# Patient Record
Sex: Female | Born: 1995
Health system: Southern US, Community
[De-identification: ages and names within clinical notes are randomized; demographics above are authoritative.]

## PROBLEM LIST (undated history)

## (undated) DIAGNOSIS — G43909 Migraine, unspecified, not intractable, without status migrainosus: Secondary | ICD-10-CM

## (undated) DIAGNOSIS — O1490 Unspecified pre-eclampsia, unspecified trimester: Secondary | ICD-10-CM

## (undated) DIAGNOSIS — N883 Incompetence of cervix uteri: Secondary | ICD-10-CM

## (undated) HISTORY — DX: Unspecified pre-eclampsia, unspecified trimester: O14.90

## (undated) HISTORY — DX: Migraine, unspecified, not intractable, without status migrainosus: G43.909

## (undated) HISTORY — PX: URETHRA SURGERY: SHX824

---
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
## 2017-05-07 ENCOUNTER — Encounter (HOSPITAL_BASED_OUTPATIENT_CLINIC_OR_DEPARTMENT_OTHER): Payer: Self-pay

## 2017-05-07 ENCOUNTER — Emergency Department (HOSPITAL_BASED_OUTPATIENT_CLINIC_OR_DEPARTMENT_OTHER)
Admission: EM | Admit: 2017-05-07 | Discharge: 2017-05-07 | Disposition: A | Discharge status: Home / Self Care | Payer: Self-pay | Attending: Emergency Medicine | Admitting: Emergency Medicine

## 2017-05-07 ENCOUNTER — Emergency Department (HOSPITAL_BASED_OUTPATIENT_CLINIC_OR_DEPARTMENT_OTHER): Payer: Self-pay

## 2017-05-07 DIAGNOSIS — F17200 Nicotine dependence, unspecified, uncomplicated: Secondary | ICD-10-CM | POA: Insufficient documentation

## 2017-05-07 DIAGNOSIS — G43909 Migraine, unspecified, not intractable, without status migrainosus: Secondary | ICD-10-CM | POA: Insufficient documentation

## 2017-05-07 LAB — PREGNANCY, URINE: PREG TEST UR: NEGATIVE

## 2017-05-07 MED ORDER — KETOROLAC TROMETHAMINE 30 MG/ML IJ SOLN
15.0000 mg | Freq: Once | INTRAMUSCULAR | Status: AC
  Administered 2017-05-07: 15 mg via INTRAVENOUS
  Filled 2017-05-07: qty 1

## 2017-05-07 MED ORDER — SODIUM CHLORIDE 0.9 % IV BOLUS (SEPSIS)
500.0000 mL | Freq: Once | INTRAVENOUS | Status: AC
  Administered 2017-05-07: 500 mL via INTRAVENOUS

## 2017-05-07 MED ORDER — PROCHLORPERAZINE EDISYLATE 5 MG/ML IJ SOLN
10.0000 mg | Freq: Once | INTRAMUSCULAR | Status: AC
  Administered 2017-05-07: 10 mg via INTRAVENOUS
  Filled 2017-05-07: qty 2

## 2017-05-07 NOTE — ED Provider Notes (Signed)
[facility] Provider Note   CSN: [id number] Arrival date & time: 05/07/17  1522     History   Chief Complaint Chief Complaint  Patient presents with  . Migraine    HPI Claire Nash is a 21 y.o. female.  HPI Patient presents with headaches. Has been having headaches over the last month. States he did not really go away. Has a history of migraines when she was younger but states this feels different. Last night however was more like her migraines she had before. Severe frontal pain. Some nausea and vomited once. States around a month ago she did hit her head on however it was having increased pain since then. No numbness or weakness. No vision changes. Some photophobia.   Past Medical History:  Diagnosis Date  . Migraines     Patient Active Problem List   Diagnosis Date Noted  . Viral illness 02/22/2014  . Back pain 10/13/2013  . Chronic pelvic pain in female 04/23/2013  . Contraception management 09/09/2012  . Viral URI 01/23/2012  . MIGRAINE WITH AURA 12/05/2010    Past Surgical History:  Procedure Laterality Date  . URETHRA SURGERY      OB History    No data available       Home Medications    Prior to Admission medications   Not on File    Family History Family History  Problem Relation Age of Onset  . Hypertension Mother   . Fibroids Mother   . Hypertension Father   . Asthma Father   . Hypertension Maternal Grandmother   . Drug abuse Maternal Grandmother   . Drug abuse Maternal Grandfather   . Breast cancer Maternal Aunt     Social History Social History  Substance Use Topics  . Smoking status: Current Some Day Smoker  . Smokeless tobacco: Never Used  . Alcohol use Yes     Comment: occ     Allergies   Patient has no known allergies.   Review of Systems Review of Systems  Constitutional: Negative for appetite change.  HENT: Negative for congestion.   Eyes: Positive for photophobia.  Respiratory: Negative for  shortness of breath.   Gastrointestinal: Negative for abdominal pain.  Genitourinary: Negative for flank pain.  Musculoskeletal: Negative for back pain.  Neurological: Positive for headaches.  Hematological: Negative for adenopathy.  Psychiatric/Behavioral: Negative for confusion.     Physical Exam Updated Vital Signs BP 132/89   Pulse 74   Temp 98.4 F (36.9 C) (Oral)   Resp 18   Ht 5\' 7"  (1.702 m)   Wt 180 lb (81.6 kg)   LMP 04/30/2017   SpO2 100%   BMI 28.19 kg/m   Physical Exam  Constitutional: She appears well-developed.  HENT:  Head: Atraumatic.  Eyes: EOM are normal.  Neck: Neck supple.  Cardiovascular: Normal rate.   Pulmonary/Chest: Effort normal.  Abdominal: Soft.  Musculoskeletal: She exhibits no edema.  Neurological: She is alert.  Skin: Skin is warm. Capillary refill takes less than 2 seconds.  Psychiatric: She has a normal mood and affect.     ED Treatments / Results  Labs (all labs ordered are listed, but only abnormal results are displayed) Labs Reviewed  PREGNANCY, URINE    EKG  EKG Interpretation None       Radiology Ct Head Wo Contrast  Result Date: 05/07/2017 CLINICAL DATA:  Headache for 10 hours.  Trauma 1 or 2 months ago. EXAM: CT HEAD WITHOUT CONTRAST TECHNIQUE: Contiguous axial images  were obtained from the base of the skull through the vertex without intravenous contrast. COMPARISON:  December 02, 2010 FINDINGS: Brain: No subdural, epidural, or subarachnoid hemorrhage. The cerebellum, brainstem, and basal cisterns are normal. No mass, mass effect, or midline shift. Ventricles and sulci are normal. No acute cortical ischemia or infarct. Vascular: No hyperdense vessel or unexpected calcification. Skull: Normal. Negative for fracture or focal lesion. Sinuses/Orbits: No acute finding. Other: None. IMPRESSION: No acute abnormalities identified to explain the patient's symptoms. Electronically Signed   By: Gerome Sam III M.D   On:  05/07/2017 17:31    Procedures Procedures (including critical care time)  Medications Ordered in ED Medications  ketorolac (TORADOL) 30 MG/ML injection 15 mg (15 mg Intravenous Given 05/07/17 1724)  prochlorperazine (COMPAZINE) injection 10 mg (10 mg Intravenous Given 05/07/17 1724)  sodium chloride 0.9 % bolus 500 mL (500 mLs Intravenous New Bag/Given 05/07/17 1713)     Initial Impression / Assessment and Plan / ED Course  I have reviewed the triage vital signs and the nursing notes.  Pertinent labs & imaging results that were available during my care of the patient were reviewed by me and considered in my medical decision making (see chart for details).     Patient with headache. History of migraines. Feels better after treatment. CT scan done due to recent head trauma. Negative. Feels better and will be discharged home.  Final Clinical Impressions(s) / ED Diagnoses   Final diagnoses:  Migraine without status migrainosus, not intractable, unspecified migraine type    New Prescriptions New Prescriptions   No medications on file     Benjiman Core, MD 05/07/17 [phone]

## 2017-05-07 NOTE — ED Triage Notes (Signed)
C/o migraine started lat night-vomited x 1 last night-NAD-steady gait

## 2017-05-07 NOTE — Discharge Instructions (Signed)
Follow-up with your Dr. as needed for the headache.

## 2019-07-13 ENCOUNTER — Other Ambulatory Visit: Payer: Self-pay

## 2019-07-13 ENCOUNTER — Inpatient Hospital Stay (HOSPITAL_COMMUNITY): Payer: Medicaid Other

## 2019-07-13 ENCOUNTER — Inpatient Hospital Stay (HOSPITAL_COMMUNITY)
Admission: AD | Admit: 2019-07-13 | Discharge: 2019-07-13 | Disposition: A | Discharge status: Home / Self Care | Payer: Medicaid Other | Attending: Obstetrics & Gynecology | Admitting: Obstetrics & Gynecology

## 2019-07-13 ENCOUNTER — Encounter (HOSPITAL_COMMUNITY): Payer: Self-pay | Admitting: *Deleted

## 2019-07-13 DIAGNOSIS — O99611 Diseases of the digestive system complicating pregnancy, first trimester: Secondary | ICD-10-CM | POA: Insufficient documentation

## 2019-07-13 DIAGNOSIS — O9989 Other specified diseases and conditions complicating pregnancy, childbirth and the puerperium: Secondary | ICD-10-CM | POA: Diagnosis not present

## 2019-07-13 DIAGNOSIS — Z3A08 8 weeks gestation of pregnancy: Secondary | ICD-10-CM | POA: Insufficient documentation

## 2019-07-13 DIAGNOSIS — O26891 Other specified pregnancy related conditions, first trimester: Secondary | ICD-10-CM

## 2019-07-13 DIAGNOSIS — O99331 Smoking (tobacco) complicating pregnancy, first trimester: Secondary | ICD-10-CM | POA: Diagnosis not present

## 2019-07-13 DIAGNOSIS — K5900 Constipation, unspecified: Secondary | ICD-10-CM | POA: Diagnosis not present

## 2019-07-13 DIAGNOSIS — F17200 Nicotine dependence, unspecified, uncomplicated: Secondary | ICD-10-CM | POA: Insufficient documentation

## 2019-07-13 DIAGNOSIS — R109 Unspecified abdominal pain: Secondary | ICD-10-CM | POA: Insufficient documentation

## 2019-07-13 DIAGNOSIS — Z803 Family history of malignant neoplasm of breast: Secondary | ICD-10-CM | POA: Diagnosis not present

## 2019-07-13 DIAGNOSIS — O26899 Other specified pregnancy related conditions, unspecified trimester: Secondary | ICD-10-CM

## 2019-07-13 LAB — COMPREHENSIVE METABOLIC PANEL
ALT: 15 U/L (ref 0–44)
AST: 23 U/L (ref 15–41)
Albumin: 3.8 g/dL (ref 3.5–5.0)
Alkaline Phosphatase: 50 U/L (ref 38–126)
Anion gap: 6 (ref 5–15)
BUN: 7 mg/dL (ref 6–20)
CO2: 25 mmol/L (ref 22–32)
Calcium: 9.6 mg/dL (ref 8.9–10.3)
Chloride: 104 mmol/L (ref 98–111)
Creatinine, Ser: 0.93 mg/dL (ref 0.44–1.00)
GFR calc Af Amer: >60 mL/min (ref >60)
GFR calc non Af Amer: >60 mL/min (ref >60)
Glucose, Bld: 84 mg/dL (ref 70–99)
Potassium: 4.6 mmol/L (ref 3.5–5.1)
Sodium: 135 mmol/L (ref 135–145)
Total Bilirubin: 0.7 mg/dL (ref 0.3–1.2)
Total Protein: 6.9 g/dL (ref 6.5–8.1)

## 2019-07-13 LAB — POCT PREGNANCY, URINE: Preg Test, Ur: POSITIVE — AB

## 2019-07-13 LAB — CBC
HCT: 39.3 % (ref 36.0–46.0)
Hemoglobin: 13.2 g/dL (ref 12.0–15.0)
MCH: 30.5 pg (ref 26.0–34.0)
MCHC: 33.6 g/dL (ref 30.0–36.0)
MCV: 90.8 fL (ref 80.0–100.0)
Platelets: 177 10*3/uL (ref 150–400)
RBC: 4.33 MIL/uL (ref 3.87–5.11)
RDW: 13.3 % (ref 11.5–15.5)
WBC: 13.5 10*3/uL — ABNORMAL HIGH (ref 4.0–10.5)
nRBC: 0 % (ref 0.0–0.2)

## 2019-07-13 LAB — HCG, QUANTITATIVE, PREGNANCY: hCG, Beta Chain, Quant, S: 102811 m[IU]/mL — ABNORMAL HIGH (ref <5)

## 2019-07-13 LAB — URINALYSIS, ROUTINE W REFLEX MICROSCOPIC
Bilirubin Urine: NEGATIVE
Glucose, UA: NEGATIVE mg/dL
Hgb urine dipstick: NEGATIVE
Ketones, ur: NEGATIVE mg/dL
Leukocytes,Ua: NEGATIVE
Nitrite: NEGATIVE
Protein, ur: NEGATIVE mg/dL
Specific Gravity, Urine: 1.012 (ref 1.005–1.030)
pH: 6 (ref 5.0–8.0)

## 2019-07-13 LAB — WET PREP, GENITAL
Clue Cells Wet Prep HPF POC: NONE SEEN
Sperm: NONE SEEN
Trich, Wet Prep: NONE SEEN
Yeast Wet Prep HPF POC: NONE SEEN

## 2019-07-13 MED ORDER — POLYETHYLENE GLYCOL 3350 17 G PO PACK: 17.0000 g | PACK | Freq: Every day | ORAL | 0 refills | Status: DC

## 2019-07-13 MED ORDER — DOCUSATE SODIUM 100 MG PO TABS: 100.0000 mg | ORAL_TABLET | Freq: Three times a day (TID) | ORAL | 0 refills | Status: DC

## 2019-07-13 NOTE — MAU Provider Note (Addendum)
Patient Claire SavoyZakiya Nash is a 23 y.o. G1P0 At 8721w2d here with complaints of on-again/off-again spotting and now with consistent abdominal pain. She denies other abnormal discharge, dysuria, NV, diarrhea, fever, chills, HA.  She has a history of constipation; she has not had a BM since Friday (3 days ago).  History     CSN: [id number]  Arrival date and time: 07/13/19 1432   First Provider Initiated Contact with Patient 07/13/19 1620      Chief Complaint  Patient presents with  . Abdominal Pain   Abdominal Pain This is a new problem. The current episode started yesterday. The onset quality is sudden. The problem occurs constantly. The problem has been unchanged. The quality of the pain is cramping. The abdominal pain does not radiate. Pertinent negatives include no constipation, diarrhea or dysuria. Nothing aggravates the pain. The pain is relieved by nothing.    OB History    Gravida  1   Para      Term      Preterm      AB      Living        SAB      TAB      Ectopic      Multiple      Live Births              Past Medical History:  Diagnosis Date  . Migraines     Past Surgical History:  Procedure Laterality Date  . URETHRA SURGERY      Family History  Problem Relation Age of Onset  . Hypertension Mother   . Fibroids Mother   . Hypertension Father   . Asthma Father   . Hypertension Maternal Grandmother   . Drug abuse Maternal Grandmother   . Drug abuse Maternal Grandfather   . Breast cancer Maternal Aunt     Social History   Tobacco Use  . Smoking status: Current Some Day Smoker  . Smokeless tobacco: Never Used  Substance Use Topics  . Alcohol use: Yes    Comment: occ  . Drug use: Yes    Types: Marijuana    Allergies: No Known Allergies  Medications Prior to Admission  Medication Sig Dispense Refill Last Dose  . Prenatal Vit-Fe Fumarate-FA (PRENATAL MULTIVITAMIN) TABS tablet Take 1 tablet by mouth daily at 12 noon.   07/13/2019 at  Unknown time    Review of Systems  HENT: Negative.   Gastrointestinal: Positive for abdominal pain. Negative for constipation and diarrhea.  Genitourinary: Negative for dysuria.  Neurological: Negative.   Psychiatric/Behavioral: Negative.    Physical Exam   Blood pressure 132/66, pulse 64, temperature 98.4 F (36.9 C), resp. rate 16, last menstrual period 05/16/2019, SpO2 99 %.  Physical Exam  Constitutional: She is oriented to person, place, and time. She appears well-developed.  HENT:  Head: Normocephalic.  Neck: Normal range of motion.  Respiratory: Effort normal.  GI: Soft.  Genitourinary:    Vagina normal.   Musculoskeletal: Normal range of motion.  Neurological: She is alert and oriented to person, place, and time.  Skin: Skin is warm and dry.    MAU Course  Procedures  MDM -UA normal -wet prep is normal -GC pending -beta HCG pending -US shows SIUP with fetal cardiac activity , no other acute processes noted.   Assessment and Plan   1. Abdominal pain affecting pregnancy   2. Abdominal pain   3. Constipation, unspecified constipation type    -Reassured patient that pregnancy  appears healthy. Most likely MSK.  -Try Miralax in liquid; buy fiber tablets. - If symptoms don't resolve, try enema.  -Return to MAU if she has a change in symptoms, bleeding or pain increases.   -List of ob providers given.  -Keep dating at 02/19/2019 (8 weeks 2 days)  Mervyn Skeeters Claire Nash 07/13/2019, 4:20 PM

## 2019-07-13 NOTE — Discharge Instructions (Signed)

## 2019-07-13 NOTE — MAU Note (Signed)
.   Claire Nash is a 23 y.o. at [redacted]w[redacted]d here in MAU reporting: lower abdominal cramping and spotting for a couple of weeks on and off LMP: 05/16/19 Onset of complaint:couple of weeks Pain score: 8 Vitals:   07/13/19 1518  BP: 132/66  Pulse: 64  Resp: 16  Temp: 98.4 F (36.9 C)  SpO2: 99%      Lab orders placed from triage: UA/UPT

## 2019-07-14 LAB — GC/CHLAMYDIA PROBE AMP (~~LOC~~) NOT AT ARMC
Chlamydia: NEGATIVE
Neisseria Gonorrhea: NEGATIVE

## 2019-07-14 LAB — ABO/RH: ABO/RH(D): O POS

## 2019-08-14 ENCOUNTER — Encounter: Payer: Self-pay | Admitting: Women's Health

## 2019-08-14 ENCOUNTER — Ambulatory Visit (INDEPENDENT_AMBULATORY_CARE_PROVIDER_SITE_OTHER): Payer: Medicaid Other | Admitting: Women's Health

## 2019-08-14 ENCOUNTER — Other Ambulatory Visit: Payer: Self-pay

## 2019-08-14 ENCOUNTER — Other Ambulatory Visit (HOSPITAL_COMMUNITY)
Admission: RE | Admit: 2019-08-14 | Discharge: 2019-08-14 | Disposition: A | Discharge status: Home / Self Care | Payer: Medicaid Other | Source: Ambulatory Visit | Attending: Women's Health | Admitting: Women's Health

## 2019-08-14 DIAGNOSIS — Z87898 Personal history of other specified conditions: Secondary | ICD-10-CM

## 2019-08-14 DIAGNOSIS — Z3A12 12 weeks gestation of pregnancy: Secondary | ICD-10-CM | POA: Diagnosis not present

## 2019-08-14 DIAGNOSIS — Z3401 Encounter for supervision of normal first pregnancy, first trimester: Secondary | ICD-10-CM

## 2019-08-14 DIAGNOSIS — F1291 Cannabis use, unspecified, in remission: Secondary | ICD-10-CM | POA: Insufficient documentation

## 2019-08-14 DIAGNOSIS — Z3400 Encounter for supervision of normal first pregnancy, unspecified trimester: Secondary | ICD-10-CM | POA: Insufficient documentation

## 2019-08-14 MED ORDER — BLOOD PRESSURE MONITOR KIT: 1.0000 | PACK | 0 refills | Status: DC

## 2019-08-14 NOTE — Progress Notes (Signed)
Patient is in the office for NOB, pt denies any pain. Currently living together with FOB.

## 2019-08-14 NOTE — Progress Notes (Signed)
History:   Claire Nash is a 23 y.o. G1P0 at 19w6dby LMP being seen today for her first obstetrical visit.  Her obstetrical history is significant for N/A - patients first pregnancy. Patient does intend to breast feed. Pregnancy history fully reviewed.  Patient reports no complaints.  PMH - migraines with aura, pt reports no migraines in 1 year. Pt also reports chronic LBP. PSH - "urinary surgery" as a baby, pt unsure of type. No surgeries since. Family Hx for Patient and Partner - no babies born with any problems. Pt reports family hx of HTN. Social Hx - pt reports smoking marijuana during early pregnancy, but has not smoked in past 3-4 weeks. Pt denies smoking and other drug use. NKDA     HISTORY: OB History  Gravida Para Term Preterm AB Living  1 0 0 0 0 0  SAB TAB Ectopic Multiple Live Births  0 0 0 0 0    # Outcome Date GA Lbr Len/2nd Weight Sex Delivery Anes PTL Lv  1 Current             Last pap smear was done - never. Will perform today.  Past Medical History:  Diagnosis Date  . Migraines    Past Surgical History:  Procedure Laterality Date  . URETHRA SURGERY     Family History  Problem Relation Age of Onset  . Hypertension Mother   . Fibroids Mother   . Hypertension Father   . Asthma Father   . Hypertension Maternal Grandmother   . Drug abuse Maternal Grandmother   . Drug abuse Maternal Grandfather   . Breast cancer Maternal Aunt    Social History   Tobacco Use  . Smoking status: Never Smoker  . Smokeless tobacco: Never Used  Substance Use Topics  . Alcohol use: Not Currently    Comment: occ  . Drug use: Not Currently    Types: Marijuana    Comment: stopped 3 weeks ago   No Known Allergies Current Outpatient Medications on File Prior to Visit  Medication Sig Dispense Refill  . Prenatal Vit-Fe Fumarate-FA (PRENATAL MULTIVITAMIN) TABS tablet Take 1 tablet by mouth daily at 12 noon.    .Mariane BaumgartenSodium 100 MG capsule Take 1 tablet (100 mg  total) by mouth 3 (three) times daily. (Patient not taking: Reported on 08/14/2019) 60 tablet 0  . polyethylene glycol (MIRALAX) 17 g packet Take 17 g by mouth daily. (Patient not taking: Reported on 08/14/2019) 14 each 0   No current facility-administered medications on file prior to visit.     Review of Systems Pertinent items noted in HPI and remainder of comprehensive ROS otherwise negative. Physical Exam:   Vitals:   08/14/19 0917  BP: 132/83  Pulse: 81  Weight: 184 lb 6.4 oz (83.6 kg)   Fetal Heart Rate (bpm): 158 Uterus:    normal, gravid, 12wk size  Pelvic Exam: Perineum: no hemorrhoids, normal perineum   Vulva: normal external genitalia, no lesions   Vagina:  normal mucosa, normal discharge   Cervix: no lesions and normal, pap smear done.    Adnexa: normal adnexa and no mass, fullness, tenderness   Bony Pelvis: average  System: General: well-developed, well-nourished female in no acute distress   Breasts:  normal appearance, no masses or tenderness bilaterally   Skin: normal coloration and turgor, no rashes   Neurologic: oriented, normal, negative, normal mood   Extremities: normal strength, tone, and muscle mass, ROM of all joints is normal  HEENT PERRLA, extraocular movement intact and sclera clear, anicteric   Mouth/Teeth mucous membranes moist, pharynx normal without lesions and dental hygiene good   Neck supple and no masses   Cardiovascular: regular rate and rhythm   Respiratory:  no respiratory distress, normal breath sounds   Abdomen: soft, non-tender; bowel sounds normal; no masses,  no organomegaly     Assessment:    Pregnancy: G1P0 Patient Active Problem List   Diagnosis Date Noted  . Supervision of normal first pregnancy, antepartum 08/14/2019  . History of marijuana use 08/14/2019  . Back pain 10/13/2013  . Chronic pelvic pain in female 04/23/2013  . MIGRAINE WITH AURA 12/05/2010     Plan:    1. Supervision of normal first pregnancy, antepartum  - Cytology - PAP( [place]) - Obstetric Panel, Including HIV - Culture, OB Urine - Genetic Screening - Babyscripts Schedule Optimization - Blood Pressure Monitor KIT; 1 kit by Does not apply route once a week.  Dispense: 1 kit; Refill: 0 - Cervicovaginal ancillary only( [place]) - safe medications in pregnancy list given -discussed N/V in pregnancy including normal expectations, pharmacologic and non-pharmacologic treatments -strict hyperemesis precautions given - anatomy scan ordered  2. Marijuana use in early pregnancy -discouraged use of marijuana in pregnancy and discussed possible fetal and maternal consequences including intractable N/V   Initial labs drawn. Continue prenatal vitamins. Genetic Screening discussed, NIPS: requested. Pt does not want to know gender at this time. Ultrasound discussed; fetal anatomic survey: requested. Problem list reviewed and updated. The nature of [facility] with multiple MDs and other Advanced Practice Providers was explained to patient; also emphasized that residents, students are part of our team. Routine obstetric precautions reviewed. Return in about 8 weeks (around 10/09/2019) for virtual ROB, needs anatomy scan scheduled between 18-20wks.   Clarisa Fling, NP  10:02 AM 08/14/2019

## 2019-08-14 NOTE — Patient Instructions (Addendum)
Safe Medications in Pregnancy   Acne: Benzoyl Peroxide Salicylic Acid  Backache/Headache: Tylenol: 2 regular strength every 4 hours OR              2 Extra strength every 6 hours  Colds/Coughs/Allergies: Benadryl (alcohol free) 25 mg every 6 hours as needed Breath right strips Claritin Cepacol throat lozenges Chloraseptic throat spray Cold-Eeze- up to three times per day Cough drops, alcohol free Flonase (by prescription only) Guaifenesin Mucinex Robitussin DM (plain only, alcohol free) Saline nasal spray/drops Sudafed (pseudoephedrine) & Actifed ** use only after [redacted] weeks gestation and if you do not have high blood pressure Tylenol Vicks Vaporub Zinc lozenges Zyrtec   Constipation: Colace Ducolax suppositories Fleet enema Glycerin suppositories Metamucil Milk of magnesia Miralax Senokot Smooth move tea  Diarrhea: Kaopectate Imodium A-D  *NO pepto Bismol  Hemorrhoids: Anusol Anusol HC Preparation H Tucks  Indigestion: Tums Maalox Mylanta Zantac  Pepcid  Insomnia: Benadryl (alcohol free) 25mg every 6 hours as needed Tylenol PM Unisom, no Gelcaps  Leg Cramps: Tums MagGel  Nausea/Vomiting:  Bonine Dramamine Emetrol Ginger extract Sea bands Meclizine  Nausea medication to take during pregnancy:  Unisom (doxylamine succinate 25 mg tablets) Take one tablet daily at bedtime. If symptoms are not adequately controlled, the dose can be increased to a maximum recommended dose of two tablets daily (1/2 tablet in the morning, 1/2 tablet mid-afternoon and one at bedtime). Vitamin B6 100mg tablets. Take one tablet twice a day (up to 200 mg per day).  Skin Rashes: Aveeno products Benadryl cream or 25mg every 6 hours as needed Calamine Lotion 1% cortisone cream  Yeast infection: Gyne-lotrimin 7 Monistat 7   **If taking multiple medications, please check labels to avoid duplicating the same active ingredients **take medication as directed on  the label ** Do not exceed 4000 mg of tylenol in 24 hours **Do not take medications that contain aspirin or ibuprofen    Morning Sickness  Morning sickness is when a woman feels nauseous during pregnancy. This nauseous feeling may or may not come with vomiting. It often occurs in the morning, but it can be a problem at any time of day. Morning sickness is most common during the first trimester. In some cases, it may continue throughout pregnancy. Although morning sickness is unpleasant, it is usually harmless unless the woman develops severe and continual vomiting (hyperemesis gravidarum), a condition that requires more intense treatment. What are the causes? The exact cause of this condition is not known, but it seems to be related to normal hormonal changes that occur in pregnancy. What increases the risk? You are more likely to develop this condition if:  You experienced nausea or vomiting before your pregnancy.  You had morning sickness during a previous pregnancy.  You are pregnant with more than one baby, such as twins. What are the signs or symptoms? Symptoms of this condition include:  Nausea.  Vomiting. How is this diagnosed? This condition is usually diagnosed based on your signs and symptoms. How is this treated? In many cases, treatment is not needed for this condition. Making some changes to what you eat may help to control symptoms. Your health care provider may also prescribe or recommend:  Vitamin B6 supplements.  Anti-nausea medicines.  Ginger. Follow these instructions at home: Medicines  Take over-the-counter and prescription medicines only as told by your health care provider. Do not use any prescription, over-the-counter, or herbal medicines for morning sickness without first talking with your health care provider.  Taking   multivitamins before getting pregnant can prevent or decrease the severity of morning sickness in most women. Eating and drinking   Eat a piece of dry toast or crackers before getting out of bed in the morning.  Eat 5 or 6 small meals a day.  Eat dry and bland foods, such as rice or a baked potato. Foods that are high in carbohydrates are often helpful.  Avoid greasy, fatty, and spicy foods.  Have someone cook for you if the smell of any food causes nausea and vomiting.  If you feel nauseous after taking prenatal vitamins, take the vitamins at night or with a snack.  Snack on protein foods between meals if you are hungry. Nuts, yogurt, and cheese are good options.  Drink fluids throughout the day.  Try ginger ale made with real ginger, ginger tea made from fresh grated ginger, or ginger candies. General instructions  Do not use any products that contain nicotine or tobacco, such as cigarettes and e-cigarettes. If you need help quitting, ask your health care provider.  Get an air purifier to keep the air in your house free of odors.  Get plenty of fresh air.  Try to avoid odors that trigger your nausea.  Consider trying these methods to help relieve symptoms: ? Wearing an acupressure wristband. These wristbands are often worn for seasickness. ? Acupuncture. Contact a health care provider if:  Your home remedies are not working and you need medicine.  You feel dizzy or light-headed.  You are losing weight. Get help right away if:  You have persistent and uncontrolled nausea and vomiting.  You faint.  You have severe pain in your abdomen. Summary  Morning sickness is when a woman feels nauseous during pregnancy. This nauseous feeling may or may not come with vomiting.  Morning sickness is most common during the first trimester.  It often occurs in the morning, but it can be a problem at any time of day.  In many cases, treatment is not needed for this condition. Making some changes to what you eat may help to control symptoms. This information is not intended to replace advice given to you by  your health care provider. Make sure you discuss any questions you have with your health care provider. Document Released: 01/31/2007 Document Revised: 11/22/2017 Document Reviewed: 01/12/2017 Elsevier Patient Education  2020 [employer]  

## 2019-08-15 LAB — OBSTETRIC PANEL, INCLUDING HIV
Antibody Screen: NEGATIVE
Basophils Absolute: 0 10*3/uL (ref 0.0–0.2)
Basos: 0 %
EOS (ABSOLUTE): 0.1 10*3/uL (ref 0.0–0.4)
Eos: 1 %
HIV Screen 4th Generation wRfx: NONREACTIVE
Hematocrit: 38.7 % (ref 34.0–46.6)
Hemoglobin: 12.7 g/dL (ref 11.1–15.9)
Hepatitis B Surface Ag: NEGATIVE
Immature Grans (Abs): 0 10*3/uL (ref 0.0–0.1)
Immature Granulocytes: 0 %
Lymphocytes Absolute: 1.9 10*3/uL (ref 0.7–3.1)
Lymphs: 18 %
MCH: 30.2 pg (ref 26.6–33.0)
MCHC: 32.8 g/dL (ref 31.5–35.7)
MCV: 92 fL (ref 79–97)
Monocytes Absolute: 0.6 10*3/uL (ref 0.1–0.9)
Monocytes: 6 %
Neutrophils Absolute: 7.9 10*3/uL — ABNORMAL HIGH (ref 1.4–7.0)
Neutrophils: 75 %
Platelets: 164 10*3/uL (ref 150–450)
RBC: 4.2 x10E6/uL (ref 3.77–5.28)
RDW: 12.7 % (ref 11.7–15.4)
RPR Ser Ql: NONREACTIVE
Rh Factor: POSITIVE
Rubella Antibodies, IGG: 11.7 index (ref >0.99)
WBC: 10.5 10*3/uL (ref 3.4–10.8)

## 2019-08-16 LAB — URINE CULTURE, OB REFLEX

## 2019-08-16 LAB — CULTURE, OB URINE

## 2019-08-17 ENCOUNTER — Telehealth: Payer: Self-pay

## 2019-08-17 ENCOUNTER — Other Ambulatory Visit: Payer: Self-pay

## 2019-08-17 ENCOUNTER — Inpatient Hospital Stay (HOSPITAL_COMMUNITY)
Admission: AD | Admit: 2019-08-17 | Discharge: 2019-08-17 | Disposition: A | Discharge status: Home / Self Care | Payer: Medicaid Other | Attending: Obstetrics and Gynecology | Admitting: Obstetrics and Gynecology

## 2019-08-17 ENCOUNTER — Encounter (HOSPITAL_COMMUNITY): Payer: Self-pay | Admitting: *Deleted

## 2019-08-17 DIAGNOSIS — Z87898 Personal history of other specified conditions: Secondary | ICD-10-CM

## 2019-08-17 DIAGNOSIS — O209 Hemorrhage in early pregnancy, unspecified: Secondary | ICD-10-CM

## 2019-08-17 DIAGNOSIS — F1291 Cannabis use, unspecified, in remission: Secondary | ICD-10-CM

## 2019-08-17 DIAGNOSIS — O26892 Other specified pregnancy related conditions, second trimester: Secondary | ICD-10-CM | POA: Insufficient documentation

## 2019-08-17 DIAGNOSIS — O26852 Spotting complicating pregnancy, second trimester: Secondary | ICD-10-CM | POA: Diagnosis not present

## 2019-08-17 DIAGNOSIS — Z3A13 13 weeks gestation of pregnancy: Secondary | ICD-10-CM | POA: Diagnosis not present

## 2019-08-17 DIAGNOSIS — R109 Unspecified abdominal pain: Secondary | ICD-10-CM | POA: Diagnosis not present

## 2019-08-17 LAB — URINALYSIS, ROUTINE W REFLEX MICROSCOPIC
Bilirubin Urine: NEGATIVE
Glucose, UA: NEGATIVE mg/dL
Ketones, ur: 5 mg/dL — AB
Nitrite: NEGATIVE
Protein, ur: NEGATIVE mg/dL
Specific Gravity, Urine: 1.016 (ref 1.005–1.030)
pH: 6 (ref 5.0–8.0)

## 2019-08-17 LAB — WET PREP, GENITAL
Clue Cells Wet Prep HPF POC: NONE SEEN
Sperm: NONE SEEN
Trich, Wet Prep: NONE SEEN

## 2019-08-17 NOTE — Discharge Instructions (Signed)

## 2019-08-17 NOTE — MAU Note (Signed)
Woke up at 0900, everything was fine. Then around 11, started cramping really bad, they kind of subside, but around 2-she went to the bathroom and there was a light pink d/c.  Now is a dark brown.

## 2019-08-17 NOTE — Telephone Encounter (Signed)
Patient call c/o cramping and bleeding that is getting heavier and darker. She was told to go to [facility] for evaluation.

## 2019-08-17 NOTE — MAU Provider Note (Signed)
History     CSN: [id number]  Arrival date and time: 08/17/19 1637   First Provider Initiated Contact with Patient 08/17/19 2055      Chief Complaint  Patient presents with  . Vaginal Bleeding  . Abdominal Pain    Claire Nash is a 23 y.o. G1P0 at 41w2dwho receives care at [facility].  She presents today for Vaginal Bleeding and Abdominal Pain.  She states she has been having an "achy feeling" that she reports she sometimes gets when she doesn't eat throughout the day. She states she has been out and about today and when she got home and used the bathroom she noted some vaginal bleeding.  She states it initially was light pink and is now dark brown, but is noted on the pantyliner and no longer just with wiping.  She states the achy feeling has since subsided and she reports relief now that she has heard the fetal heart rate.  She reports that she has sexual activity on Thursday.  She reports white discharge prior to the bleeding, but states it was not of concern.      OB History    Gravida  1   Para      Term      Preterm      AB      Living        SAB      TAB      Ectopic      Multiple      Live Births              Past Medical History:  Diagnosis Date  . Migraines     Past Surgical History:  Procedure Laterality Date  . NO PAST SURGERIES    . URETHRA SURGERY      Family History  Problem Relation Age of Onset  . Hypertension Mother   . Fibroids Mother   . Hypertension Father   . Asthma Father   . Hypertension Maternal Grandmother   . Drug abuse Maternal Grandmother   . Drug abuse Maternal Grandfather   . Breast cancer Maternal Aunt     Social History   Tobacco Use  . Smoking status: Never Smoker  . Smokeless tobacco: Never Used  Substance Use Topics  . Alcohol use: Not Currently    Comment: occ  . Drug use: Not Currently    Types: Marijuana    Comment: stopped 3 weeks ago    Allergies: No Known Allergies  Medications Prior  to Admission  Medication Sig Dispense Refill Last Dose  . Prenatal Vit-Fe Fumarate-FA (PRENATAL MULTIVITAMIN) TABS tablet Take 1 tablet by mouth daily at 12 noon.   08/17/2019 at Unknown time  . Blood Pressure Monitor KIT 1 kit by Does not apply route once a week. 1 kit 0 Unknown at Unknown time  . Docusate Sodium 100 MG capsule Take 1 tablet (100 mg total) by mouth 3 (three) times daily. (Patient not taking: Reported on 08/14/2019) 60 tablet 0   . polyethylene glycol (MIRALAX) 17 g packet Take 17 g by mouth daily. (Patient not taking: Reported on 08/14/2019) 14 each 0     Review of Systems  Constitutional: Negative for chills and fever.  Respiratory: Negative for cough and shortness of breath.   Gastrointestinal: Negative for constipation, diarrhea, nausea and vomiting.  Genitourinary: Positive for vaginal bleeding and vaginal discharge. Negative for difficulty urinating and dysuria.  Neurological: Negative for dizziness, light-headedness and headaches.   Physical Exam  Blood pressure 140/74, pulse 83, temperature 98.3 F (36.8 C), temperature source Oral, resp. rate 16, height _0  (1.676 m), weight 81.5 kg, last menstrual period 05/16/2019, SpO2 100 %.  Physical Exam  Constitutional: She is oriented to person, place, and time. She appears well-developed and well-nourished.  HENT:  Head: Normocephalic and atraumatic.  Eyes: Conjunctivae are normal.  Neck: Normal range of motion.  Cardiovascular: Normal rate.  Respiratory: Effort normal.  GI: Soft.  Genitourinary:    Vaginal discharge present.     No vaginal bleeding.  No bleeding in the vagina.    Genitourinary Comments: Speculum Exam: -Normal External Genitalia: Non tender, no apparent discharge at introitus.  -Vaginal Vault: Pink mucosa with good rugae. Moderate amt brown curdy discharge in vault removed with 2 faux swabs -wet prep collected -Cervix:Pink, no lesions, cysts, or polyps. Friable particular to 6 'o'clock area.  Appears closed. No active bleeding from os, some white mucoid discharge -Bimanual Exam:  Closed, Nontender, Uterus c/w 12-[redacted] wk GA    Musculoskeletal: Normal range of motion.  Neurological: She is alert and oriented to person, place, and time.  Skin: Skin is warm and dry.  Psychiatric: She has a normal mood and affect. Her behavior is normal.    MAU Course  Procedures Results for orders placed or performed during the hospital encounter of 08/17/19 (from the past 24 hour(s))  Urinalysis, Routine w reflex microscopic     Status: Abnormal   Collection Time: 08/17/19  6:30 PM  Result Value Ref Range   Color, Urine YELLOW YELLOW   APPearance CLEAR CLEAR   Specific Gravity, Urine 1.016 1.005 - 1.030   pH 6.0 5.0 - 8.0   Glucose, UA NEGATIVE NEGATIVE mg/dL   Hgb urine dipstick SMALL (A) NEGATIVE   Bilirubin Urine NEGATIVE NEGATIVE   Ketones, ur 5 (A) NEGATIVE mg/dL   Protein, ur NEGATIVE NEGATIVE mg/dL   Nitrite NEGATIVE NEGATIVE   Leukocytes,Ua TRACE (A) NEGATIVE   RBC / HPF 0-5 0 - 5 RBC/hpf   WBC, UA 0-5 0 - 5 WBC/hpf   Bacteria, UA RARE (A) NONE SEEN   Squamous Epithelial / LPF 0-5 0 - 5   Mucus PRESENT     MDM Physical Exam  Labs: UA, Wet Prep Assessment and Plan  23 year old G1P0 SIUP at 13.2 weeks Spotting-Self Limiting  -Exam findings discussed. -Extensive discussion regarding spotting in pregnancy including when it occurs and when to be more concerned. -Wet prep collected. -Patient given option to wait for results or discharge to home since pain has subsided. -Patient opts for discharge today. -Informed that provider will call if results are abnormal or office would contact if provider becomes too busy. -Patient verbalized understanding and denies questions or concerns. -Bleeding Precautions Given. -Encouraged to call or return to MAU if symptoms worsen or with the onset of new symptoms. -Discharged to home in stable condition.  Maryann Conners MSN,  CNM 08/17/2019, 8:55 PM

## 2019-08-17 NOTE — Progress Notes (Signed)
Pt and support person upset with wait times.  Provided current wait times at 4:42, pt wait time currently at 3:40.  Updated with estimated time to room of 15-20 minutes.  MAU Charge RN aware.

## 2019-08-18 ENCOUNTER — Other Ambulatory Visit (HOSPITAL_COMMUNITY): Payer: Self-pay

## 2019-08-18 DIAGNOSIS — B3731 Acute candidiasis of vulva and vagina: Secondary | ICD-10-CM

## 2019-08-18 DIAGNOSIS — B373 Candidiasis of vulva and vagina: Secondary | ICD-10-CM

## 2019-08-18 DIAGNOSIS — O98819 Other maternal infectious and parasitic diseases complicating pregnancy, unspecified trimester: Secondary | ICD-10-CM

## 2019-08-18 LAB — CYTOLOGY - PAP: Diagnosis: NEGATIVE

## 2019-08-18 MED ORDER — TERCONAZOLE 0.4 % VA CREA: 1.0000 | TOPICAL_CREAM | Freq: Every day | VAGINAL | 0 refills | Status: DC

## 2019-08-18 NOTE — Progress Notes (Signed)
Please call patient and inform of yeast infection. Rx for [employer] sent to pharmacy on file.  Thanks, JE

## 2019-08-19 LAB — CERVICOVAGINAL ANCILLARY ONLY
Bacterial vaginitis: NEGATIVE
Candida vaginitis: POSITIVE — AB
Chlamydia: NEGATIVE
Neisseria Gonorrhea: NEGATIVE
Trichomonas: NEGATIVE

## 2019-08-20 ENCOUNTER — Other Ambulatory Visit: Payer: Self-pay | Admitting: *Deleted

## 2019-08-20 DIAGNOSIS — B373 Candidiasis of vulva and vagina: Secondary | ICD-10-CM

## 2019-08-20 DIAGNOSIS — B3731 Acute candidiasis of vulva and vagina: Secondary | ICD-10-CM

## 2019-08-20 MED ORDER — TERCONAZOLE 0.4 % VA CREA: 1.0000 | TOPICAL_CREAM | Freq: Every day | VAGINAL | 0 refills | Status: DC

## 2019-08-20 NOTE — Progress Notes (Signed)
Pt called to office stating pharmacy did not receive her Rx for yeast tx.  Call placed to pharmacy, they states no Rx received. Terazol has been resent to [facility] today.  Attempt to call pt, no answer VM full.

## 2019-08-24 ENCOUNTER — Encounter: Payer: Self-pay | Admitting: Obstetrics and Gynecology

## 2019-08-25 ENCOUNTER — Other Ambulatory Visit: Payer: Self-pay | Admitting: Women's Health

## 2019-08-26 ENCOUNTER — Encounter: Payer: Self-pay | Admitting: Obstetrics and Gynecology

## 2019-09-07 ENCOUNTER — Encounter: Payer: Self-pay | Admitting: Obstetrics and Gynecology

## 2019-09-28 ENCOUNTER — Other Ambulatory Visit (HOSPITAL_COMMUNITY): Payer: Self-pay | Admitting: Obstetrics and Gynecology

## 2019-09-28 ENCOUNTER — Ambulatory Visit (HOSPITAL_COMMUNITY)
Admission: RE | Admit: 2019-09-28 | Discharge: 2019-09-28 | Disposition: A | Discharge status: Home / Self Care | Payer: Medicaid Other | Source: Ambulatory Visit | Attending: Obstetrics and Gynecology | Admitting: Obstetrics and Gynecology

## 2019-09-28 ENCOUNTER — Other Ambulatory Visit: Payer: Self-pay

## 2019-09-28 ENCOUNTER — Other Ambulatory Visit: Payer: Self-pay | Admitting: Women's Health

## 2019-09-28 DIAGNOSIS — Z148 Genetic carrier of other disease: Secondary | ICD-10-CM

## 2019-09-28 DIAGNOSIS — O359XX0 Maternal care for (suspected) fetal abnormality and damage, unspecified, not applicable or unspecified: Secondary | ICD-10-CM

## 2019-09-28 DIAGNOSIS — O26872 Cervical shortening, second trimester: Secondary | ICD-10-CM | POA: Diagnosis not present

## 2019-09-28 DIAGNOSIS — Z3400 Encounter for supervision of normal first pregnancy, unspecified trimester: Secondary | ICD-10-CM | POA: Diagnosis present

## 2019-09-28 DIAGNOSIS — Z3A19 19 weeks gestation of pregnancy: Secondary | ICD-10-CM | POA: Diagnosis not present

## 2019-09-28 MED ORDER — PROGESTERONE MICRONIZED 200 MG PO CAPS: 200.0000 mg | ORAL_CAPSULE | Freq: Every day | ORAL | 1 refills | Status: DC

## 2019-09-29 ENCOUNTER — Other Ambulatory Visit (HOSPITAL_COMMUNITY): Payer: Self-pay | Admitting: *Deleted

## 2019-09-29 ENCOUNTER — Encounter: Payer: Self-pay | Admitting: Women's Health

## 2019-09-29 DIAGNOSIS — Z148 Genetic carrier of other disease: Secondary | ICD-10-CM | POA: Insufficient documentation

## 2019-09-29 DIAGNOSIS — O26872 Cervical shortening, second trimester: Secondary | ICD-10-CM

## 2019-09-29 DIAGNOSIS — O283 Abnormal ultrasonic finding on antenatal screening of mother: Secondary | ICD-10-CM | POA: Insufficient documentation

## 2019-09-29 DIAGNOSIS — O26879 Cervical shortening, unspecified trimester: Secondary | ICD-10-CM | POA: Insufficient documentation

## 2019-10-09 ENCOUNTER — Encounter: Payer: Self-pay | Admitting: Family Medicine

## 2019-10-09 ENCOUNTER — Other Ambulatory Visit: Payer: Self-pay

## 2019-10-09 ENCOUNTER — Ambulatory Visit (INDEPENDENT_AMBULATORY_CARE_PROVIDER_SITE_OTHER): Payer: Medicaid Other | Admitting: Family Medicine

## 2019-10-09 VITALS — BP 128/82 | HR 85 | Wt 193.0 lb

## 2019-10-09 DIAGNOSIS — O99612 Diseases of the digestive system complicating pregnancy, second trimester: Secondary | ICD-10-CM

## 2019-10-09 DIAGNOSIS — O26872 Cervical shortening, second trimester: Secondary | ICD-10-CM

## 2019-10-09 DIAGNOSIS — Z3400 Encounter for supervision of normal first pregnancy, unspecified trimester: Secondary | ICD-10-CM

## 2019-10-09 DIAGNOSIS — O283 Abnormal ultrasonic finding on antenatal screening of mother: Secondary | ICD-10-CM

## 2019-10-09 DIAGNOSIS — K5900 Constipation, unspecified: Secondary | ICD-10-CM

## 2019-10-09 DIAGNOSIS — O26879 Cervical shortening, unspecified trimester: Secondary | ICD-10-CM

## 2019-10-09 DIAGNOSIS — Z148 Genetic carrier of other disease: Secondary | ICD-10-CM

## 2019-10-09 DIAGNOSIS — Z3A20 20 weeks gestation of pregnancy: Secondary | ICD-10-CM

## 2019-10-09 MED ORDER — POLYETHYLENE GLYCOL 3350 17 GM/SCOOP PO POWD: 1.0000 | Freq: Once | ORAL | 2 refills | Status: AC

## 2019-10-09 NOTE — Patient Instructions (Signed)

## 2019-10-09 NOTE — Progress Notes (Signed)
   Subjective:  Claire Nash is a 23 y.o. G1P0 at [redacted]w[redacted]d being seen today for ongoing prenatal care.  She is currently monitored for the following issues for this high-risk pregnancy and has [place]; Chronic pelvic pain in female; Back pain; Supervision of normal first pregnancy, antepartum; History of marijuana use; Short cervix affecting pregnancy; Genetic carrier; and Echogenic intracardiac focus of fetus on prenatal ultrasound on their problem list.  Patient reports backache and fatigue.  Contractions: Not present.  .  Movement: Present. Denies leaking of fluid.   The following portions of the patient's history were reviewed and updated as appropriate: allergies, current medications, past family history, past medical history, past social history, past surgical history and problem list. Problem list updated.  Objective:   Vitals:   10/09/19 0908  BP: 128/82  Pulse: 85  Weight: 193 lb (87.5 kg)    Fetal Status: Fetal Heart Rate (bpm): 152   Movement: Present     General:  Alert, oriented and cooperative. Patient is in no acute distress.  Skin: Skin is warm and dry. No rash noted.   Cardiovascular: Normal heart rate noted  Respiratory: Normal respiratory effort, no problems with respiration noted  Abdomen: Soft, gravid, appropriate for gestational age. Pain/Pressure: Present     Pelvic:       Cervical exam deferred        Extremities: Normal range of motion.  Edema: None  Mental Status: Normal mood and affect. Normal behavior. Normal judgment and thought content.   Urinalysis:      Assessment and Plan:  Pregnancy: G1P0 at [redacted]w[redacted]d  1. Supervision of normal first pregnancy, antepartum   2. Constipation during pregnancy in second trimester Discussed high fiber diet Accepts miralax rx - polyethylene glycol powder (GLYCOLAX/MIRALAX) 17 GM/SCOOP powder; Take 255 g by mouth once for 1 dose. Take one capful with water once daily for constipation  Dispense: 578 g; Refill: 2   3. Short cervix affecting pregnancy Reports compliance with vaginal progesterone, no missed doses Stressed importance of adherence for best outcomes in preventing PTD Pt aware of upcoming [place] for recheck of cervical length and genetic counselor visit on 10/19  4. Genetic carrier Has genetic counselor visit scheduled for same day as [place] on 10/19  5. Echogenic intracardiac focus of fetus on prenatal ultrasound No other abnormal findings, likely normal variant per MFM  Preterm labor symptoms and general obstetric precautions including but not limited to vaginal bleeding, contractions, leaking of fluid and fetal movement were reviewed in detail with the patient. Please refer to After Visit Summary for other counseling recommendations.  Return in about 4 weeks (around 11/06/2019) for virtual, ob visit.   Clarnce Flock, MD

## 2019-10-09 NOTE — Progress Notes (Signed)
ROB  U/S was on 09/28/19. Next U/S scheduled for 10/12/19.  CC: Pressure.

## 2019-10-12 ENCOUNTER — Ambulatory Visit (HOSPITAL_COMMUNITY): Payer: Self-pay | Admitting: Genetic Counselor

## 2019-10-12 ENCOUNTER — Encounter (HOSPITAL_COMMUNITY): Payer: Self-pay | Admitting: *Deleted

## 2019-10-12 ENCOUNTER — Ambulatory Visit (HOSPITAL_COMMUNITY): Payer: Medicaid Other | Admitting: *Deleted

## 2019-10-12 ENCOUNTER — Ambulatory Visit (HOSPITAL_BASED_OUTPATIENT_CLINIC_OR_DEPARTMENT_OTHER): Payer: Medicaid Other | Admitting: Genetic Counselor

## 2019-10-12 ENCOUNTER — Other Ambulatory Visit: Payer: Self-pay

## 2019-10-12 ENCOUNTER — Ambulatory Visit (HOSPITAL_COMMUNITY)
Admission: RE | Admit: 2019-10-12 | Discharge: 2019-10-12 | Disposition: A | Discharge status: Home / Self Care | Payer: Medicaid Other | Source: Ambulatory Visit | Attending: Obstetrics and Gynecology | Admitting: Obstetrics and Gynecology

## 2019-10-12 DIAGNOSIS — F1291 Cannabis use, unspecified, in remission: Secondary | ICD-10-CM

## 2019-10-12 DIAGNOSIS — Z3A21 21 weeks gestation of pregnancy: Secondary | ICD-10-CM

## 2019-10-12 DIAGNOSIS — O26872 Cervical shortening, second trimester: Secondary | ICD-10-CM | POA: Diagnosis present

## 2019-10-12 DIAGNOSIS — Z148 Genetic carrier of other disease: Secondary | ICD-10-CM

## 2019-10-12 DIAGNOSIS — O359XX0 Maternal care for (suspected) fetal abnormality and damage, unspecified, not applicable or unspecified: Secondary | ICD-10-CM

## 2019-10-12 DIAGNOSIS — O26879 Cervical shortening, unspecified trimester: Secondary | ICD-10-CM | POA: Diagnosis present

## 2019-10-12 DIAGNOSIS — O283 Abnormal ultrasonic finding on antenatal screening of mother: Secondary | ICD-10-CM

## 2019-10-12 DIAGNOSIS — Z315 Encounter for genetic counseling: Secondary | ICD-10-CM | POA: Diagnosis not present

## 2019-10-12 DIAGNOSIS — Z87898 Personal history of other specified conditions: Secondary | ICD-10-CM | POA: Insufficient documentation

## 2019-10-12 DIAGNOSIS — O09522 Supervision of elderly multigravida, second trimester: Secondary | ICD-10-CM

## 2019-10-12 DIAGNOSIS — Z31430 Encounter of female for testing for genetic disease carrier status for procreative management: Secondary | ICD-10-CM

## 2019-10-12 NOTE — Progress Notes (Signed)
10/12/2019  Britnie Colville 1996-06-14 MRN: [id number] DOV: 10/12/2019  Ms. 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 Ms. Lindquist came to her appointment alone due to COVID-19 visitor restrictions.   Indication for genetic counseling - Increased risk to be silent carrier for spinal muscular atrophy  Prenatal history  Ms. Cadle is a G30P0, 23 y.o. female. Her current pregnancy has completed [redacted]w[redacted]d (Estimated Date of Delivery: 02/20/20).  Ms. Wenk denied exposure to environmental toxins or chemical agents. She denied the use of alcohol and street drugs. She quit smoking cigarettes on 07/25/19. She reported taking Prometrium. She denied significant viral illnesses, fevers, and bleeding during the course of her pregnancy. Her medical and surgical histories were noncontributory.  Family History  A three generation pedigree was drafted and reviewed. The family history is remarkable for the following:  - Ms. Monteverde has a maternal aunt who had five miscarriages, one stillborn daughter, and one healthy son. She was not sure of the cause of the stillbirth. There are many potential causes of recurrent pregnancy loss, including anatomic, immunologic, endocrine, and genetic factors. However, a cause is only identified in 50% of individuals who experience recurrent miscarriages. Abnormalities of chromosome number or structure are the most common cause of sporadic early pregnancy loss. A significant proportion of recurrent miscarriages may also be associated with structural or numerical chromosome abnormalities. We discussed that 2-5% of couples who experience multiple miscarriages carry a balanced translocation that can become unbalanced and potentially lethal in their offspring. In addition to chromosomal abnormalities, certain single gene, X-linked, and polygenic/multifactorial disorders are  associated with recurrent miscarriage. Ms. Kamp was informed if she were interested, her aunt could have an evaluation with a prenatal genetic counselor to assess the cause of her recurrent pregnancy losses.  - Ms. Staebell has a paternal uncle with mild intellectual disability who still lives at home with his parents. This individual's medical history is otherwise noncontributory. We discussed that many times, intellectual disability is multifactorial in nature, occurring due to a combination of genetic and environmental factors that are difficult to identify. Intellectual disability can appear to run in families; thus, there is a chance that Ms. Lipsey's children could also experience intellectual disabilities of some kind. Ms. Donica understands that she should make the pediatrician aware of any concerns she has about her children's development.  - Ms. Waltman's partner, Gillis Ends, has a maternal uncle who has a son with spinal muscular atrophy. This uncle has nine other children who are healthy. See Discussion section for more details.   The remaining family histories were reviewed and found to be noncontributory for birth defects, intellectual disability, recurrent pregnancy loss, and known genetic conditions.    The patient's ethnicity is African American. The father of the pregnancy's ethnicity is African American. Consanguinity was denied. Ms. Patteson was unsure if she has any Ashkenazi Jewish ancestry. Pedigree will be scanned under Media.  Discussion  Ms. Gains had Horizon-14 carrier screening performed through [place]. 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 This puts her at increased risk (1 in 41) to be a silent 2+0 carrier for spinal muscular atrophy (SMA). SMA is a condition caused by mutations in the SMN1 gene. SMA is characterized by progressive muscle  weakness and atrophy due to degeneration and loss of anterior  horn cells (lower motor neurons) in the spinal cord and brain stem. We discussed the different types of SMA (0, I, II, and III), including differences in severity and age of onset. We also reviewed the autosomal recessive inheritance pattern of SMA. If both parents are carriers of SMA, there is a 1 in 4 (25%) chance of having an affected fetus.   We discussed that carrier testing by copy number analysis is recommended for the father of the pregnancy, Harrie JeansBrandon McGee. Based on his family history of having a cousin affected by SMA, Mr. Mila PalmerMcGee has a 1 in 3 (33%) chance of being a carrier for SMA himself. Given Ms. Flett's 1 in 34 chance of being a silent carrier and her partner's 1 in 3 chance of being a carrier, the couple currently has a 1 in 408 risk of having a fetus affected by SMA. If Ms. Fana's partner is found to have 2 copies of SMN1 on carrier screening, his risk of being a carrier is reduced but not eliminated. Ms. Cyndia DiverMcFadden indicated that she is interested in pursuing partner carrier screening.   Ms. Cyndia DiverMcFadden was informed that select genetic conditions are included on [place] 's newborn screen, but that SMA currently is not included. We discussed the Early Check research study to add SMA to her baby's newborn screening panel. Ms. Cyndia DiverMcFadden indicated that she was interested in pursuing this, so she was given written information on how to enroll in the Early Check study.   Ms. Gruner's carrier screening was negative for the other 13 conditions screened. Thus, her risk to be a carrier for these additional conditions (listed separately in the laboratory report) has been reduced but not eliminated.  We also reviewed that Ms. Matzek had Panorama NIPS through [employer] that was low-risk for fetal aneuploidies. We reviewed that these results showed a less than 1 in 10,000 risk for trisomies 21, 18 and 13, and monosomy X (Turner  syndrome).  In addition, the risk for triploidy and sex chromosome trisomies (47,XXX and 47,XXY) was also low. We reviewed that while this testing identifies >99% of pregnancies with trisomy 4221, trisomy 6413, and triploidy, it is NOT diagnostic. A positive test result requires confirmation by CVS or amniocentesis, and a negative test result does not rule out a fetal chromosome abnormality. She also understands that this testing does not identify all genetic conditions.  A complete ultrasound was performed two weeks ago. The ultrasound report will be sent under separate cover. An echogenic intracardiac focus (EIF) was identified on ultrasound. This finding is considered to be a normal variant in the context of low risk NIPS results.There were no other visualized fetal anomalies or markers suggestive of aneuploidy. Ms. Cyndia DiverMcFadden understands that screening tests, including ultrasound, cannot rule out all birth defects or genetic syndromes.  Ms. Cyndia DiverMcFadden was also counseled regarding diagnostic testing via amniocentesis. We discussed the technical aspects of the procedure and quoted up to a 1 in 500 (0.2%) risk for spontaneous pregnancy loss or other adverse pregnancy outcomes as a result of amniocentesis. Cultured cells from an amniocentesis sample allow for the visualization of a fetal karyotype, which can detect >99% of chromosomal aberrations. Chromosomal microarray can also be performed to identify smaller deletions or duplications of fetal chromosomal material. Amniocentesis could also be performed to assess whether the baby is affected by SMA. After careful consideration, Ms. Cyndia DiverMcFadden declined amniocentesis at this time. She understands that amniocentesis is available at any point after 16 weeks of pregnancy and that she may  opt to undergo the procedure at a later date should she change her mind.  Lastly, screening for open neural tube defects (ONTDs) via MS-AFP in the second trimester in addition to level II  ultrasound examination is recommended. Ms. Farner's level II ultrasound did not detect any ONTDs, and level II ultrasound is able to detect them with 90-95% sensitivity. However, normal results from any of the above options do not guarantee a normal baby, as 3-5% of newborns have some type of birth defect, many of which are not prenatally diagnosable.  Ms. Grimsley was interested in pursuing SMA carrier screening for her partner, Harrie Jeans. Mr. Mila Palmer was waiting in the car during Ms. Botting's appointment and had his blood drawn today. Results from Mr. McGee's SMA carrier screening will take 2-3 weeks to be returned. I will call Mr. Mila Palmer and Ms. Gloster when results become available.  I counseled Ms. Sosinski regarding the above risks and available options. The approximate face-to-face time with the genetic counselor was 25 minutes.  In summary:  Discussed carrier screening results and options for follow-up testing  Increased risk to be silent carrier for SMA  Desires partner carrier screening. Ms. Gasparro's partner, Harrie Jeans, had his blood drawn today; we will follow results  Reviewed low-risk NIPS results  Reduction in risk for Down syndrome, trisomy 61, trisomy 84, sex chromosome aneuploidies, and 22q11 deletion syndrome  Reviewed results of ultrasound  EIF identified (normal variant in context of low-risk NIPS)  No other fetal anomalies or markers seen  Reduction in risk for fetal aneuploidy  Offered additional testing and screening  Declined amniocentesis  Recommend MS-AFP screening  Reviewed family history concerns   Gershon Crane, MS Genetic Counselor

## 2019-10-16 ENCOUNTER — Telehealth: Payer: Self-pay

## 2019-10-16 NOTE — Telephone Encounter (Signed)
Spoke with pt and asked her to check her BP, she said that she checked it yesterday and it was normal and that she was feeling better now.  Advised her to go to the [facility] if sx worsens.

## 2019-10-20 ENCOUNTER — Telehealth (HOSPITAL_COMMUNITY): Payer: Self-pay | Admitting: Genetic Counselor

## 2019-10-20 NOTE — Telephone Encounter (Signed)
I called Ms. Throgmorton to discuss her partner, Erlene Quan McGee's spinal muscular atrophy (SMA) carrier screening results. Mr. Rosemary Holms carrier screening was positive for an increased risk to be a silent carrier for SMA, the same result that Ms. Driskill had received herself.Mr. Sallee Provencal was found to have 2 copies of the SMN1 gene; however, he also has the c.*3+80T>G polymorphism of SMN1 in intron 7 (also known as g.27134T>G). This puts him at increased risk (1 in 34) to be a silent 2+0 carrier for SMA. Given that both Ms. Hadley and her partner have a 1 in 95 chance of being a silent carrier for SMA, the couple has a 1 in 4624 (0.02%) chance of having a child affected by SMA. This decreased from their initial 1 in 408 risk based on Mr. Rosemary Holms positive family history of SMA.  We reviewed that Ms. Shepperson could opt to undergo an amniocentesis to determine the SMA status for the current pregnancy with certainty. However, there is a 1 in 774 risk for complications that could lead to miscarriage associated with the amniocentesis procedure. Ms. Maeder was comfortable with their adjusted risk and declined undergoing an amniocentesis at this time. She understands that she may change her mind at any time.  We again discussed theEarly Checkresearch studyto add SMA to her baby's newborn screening panel for free. This would determine whether or not the baby is affected by SMA a few days after birth. Ms. Woodbeck was provided with information on how to enroll in the Early Check study during her genetic counseling appointment. Sheconfirmed thatshe had no further questions at this time.  Buelah Manis, MS Genetic Counselor

## 2019-11-05 ENCOUNTER — Other Ambulatory Visit: Payer: Self-pay

## 2019-11-05 ENCOUNTER — Telehealth: Payer: Self-pay

## 2019-11-05 DIAGNOSIS — Z3400 Encounter for supervision of normal first pregnancy, unspecified trimester: Secondary | ICD-10-CM

## 2019-11-05 MED ORDER — BLOOD PRESSURE MONITOR KIT: 1.0000 | PACK | 0 refills | Status: DC

## 2019-11-05 NOTE — Telephone Encounter (Signed)
Wanted to reach out to pt to make her aware I sent her B/P kit and I responded to her message regarding her sx's of HA and visual changes. Pt was not ava went to vm.  I left a detailed VM for pt to report to MAU for an evaluation of her B/P.

## 2019-11-06 ENCOUNTER — Encounter: Payer: Self-pay | Admitting: Obstetrics

## 2019-11-06 ENCOUNTER — Inpatient Hospital Stay (HOSPITAL_COMMUNITY)
Admission: AD | Admit: 2019-11-06 | Discharge: 2019-11-06 | Disposition: A | Discharge status: Home / Self Care | Payer: Medicaid Other | Attending: Obstetrics and Gynecology | Admitting: Obstetrics and Gynecology

## 2019-11-06 ENCOUNTER — Encounter (HOSPITAL_COMMUNITY): Payer: Self-pay | Admitting: *Deleted

## 2019-11-06 ENCOUNTER — Telehealth (INDEPENDENT_AMBULATORY_CARE_PROVIDER_SITE_OTHER): Payer: Medicaid Other | Admitting: Obstetrics

## 2019-11-06 ENCOUNTER — Other Ambulatory Visit: Payer: Self-pay

## 2019-11-06 DIAGNOSIS — Z3A24 24 weeks gestation of pregnancy: Secondary | ICD-10-CM | POA: Diagnosis not present

## 2019-11-06 DIAGNOSIS — O26892 Other specified pregnancy related conditions, second trimester: Secondary | ICD-10-CM | POA: Diagnosis not present

## 2019-11-06 DIAGNOSIS — R030 Elevated blood-pressure reading, without diagnosis of hypertension: Secondary | ICD-10-CM | POA: Insufficient documentation

## 2019-11-06 DIAGNOSIS — O99891 Other specified diseases and conditions complicating pregnancy: Secondary | ICD-10-CM | POA: Insufficient documentation

## 2019-11-06 DIAGNOSIS — R519 Headache, unspecified: Secondary | ICD-10-CM | POA: Diagnosis not present

## 2019-11-06 DIAGNOSIS — G8929 Other chronic pain: Secondary | ICD-10-CM

## 2019-11-06 DIAGNOSIS — Z8249 Family history of ischemic heart disease and other diseases of the circulatory system: Secondary | ICD-10-CM | POA: Insufficient documentation

## 2019-11-06 LAB — COMPREHENSIVE METABOLIC PANEL
ALT: 21 U/L (ref 0–44)
AST: 20 U/L (ref 15–41)
Albumin: 2.7 g/dL — ABNORMAL LOW (ref 3.5–5.0)
Alkaline Phosphatase: 65 U/L (ref 38–126)
Anion gap: 9 (ref 5–15)
BUN: 7 mg/dL (ref 6–20)
CO2: 20 mmol/L — ABNORMAL LOW (ref 22–32)
Calcium: 8.3 mg/dL — ABNORMAL LOW (ref 8.9–10.3)
Chloride: 102 mmol/L (ref 98–111)
Creatinine, Ser: 0.72 mg/dL (ref 0.44–1.00)
GFR calc Af Amer: >60 mL/min (ref >60)
GFR calc non Af Amer: >60 mL/min (ref >60)
Glucose, Bld: 87 mg/dL (ref 70–99)
Potassium: 3.2 mmol/L — ABNORMAL LOW (ref 3.5–5.1)
Sodium: 131 mmol/L — ABNORMAL LOW (ref 135–145)
Total Bilirubin: 0.6 mg/dL (ref 0.3–1.2)
Total Protein: 5.9 g/dL — ABNORMAL LOW (ref 6.5–8.1)

## 2019-11-06 LAB — CBC
HCT: 31.6 % — ABNORMAL LOW (ref 36.0–46.0)
Hemoglobin: 10.6 g/dL — ABNORMAL LOW (ref 12.0–15.0)
MCH: 30.6 pg (ref 26.0–34.0)
MCHC: 33.5 g/dL (ref 30.0–36.0)
MCV: 91.3 fL (ref 80.0–100.0)
Platelets: 162 10*3/uL (ref 150–400)
RBC: 3.46 MIL/uL — ABNORMAL LOW (ref 3.87–5.11)
RDW: 13.3 % (ref 11.5–15.5)
WBC: 13.3 10*3/uL — ABNORMAL HIGH (ref 4.0–10.5)
nRBC: 0 % (ref 0.0–0.2)

## 2019-11-06 LAB — URINALYSIS, ROUTINE W REFLEX MICROSCOPIC
Bilirubin Urine: NEGATIVE
Glucose, UA: NEGATIVE mg/dL
Hgb urine dipstick: NEGATIVE
Ketones, ur: NEGATIVE mg/dL
Nitrite: NEGATIVE
Protein, ur: NEGATIVE mg/dL
Specific Gravity, Urine: 1.006 (ref 1.005–1.030)
pH: 7 (ref 5.0–8.0)

## 2019-11-06 LAB — PROTEIN / CREATININE RATIO, URINE
Creatinine, Urine: 41.63 mg/dL
Protein Creatinine Ratio: 0.26 mg/mg{Cre} — ABNORMAL HIGH (ref 0.00–0.15)
Total Protein, Urine: 11 mg/dL

## 2019-11-06 MED ORDER — DIPHENHYDRAMINE HCL 50 MG/ML IJ SOLN
25.0000 mg | Freq: Once | INTRAMUSCULAR | Status: AC
  Administered 2019-11-06: 25 mg via INTRAVENOUS
  Filled 2019-11-06: qty 1

## 2019-11-06 MED ORDER — DEXAMETHASONE SODIUM PHOSPHATE 10 MG/ML IJ SOLN
10.0000 mg | Freq: Once | INTRAMUSCULAR | Status: AC
  Administered 2019-11-06: 10 mg via INTRAVENOUS
  Filled 2019-11-06: qty 1

## 2019-11-06 MED ORDER — METOCLOPRAMIDE HCL 5 MG/ML IJ SOLN
10.0000 mg | Freq: Once | INTRAMUSCULAR | Status: AC
  Administered 2019-11-06: 10 mg via INTRAVENOUS
  Filled 2019-11-06: qty 2

## 2019-11-06 MED ORDER — LACTATED RINGERS IV BOLUS
1000.0000 mL | Freq: Once | INTRAVENOUS | Status: AC
  Administered 2019-11-06: 1000 mL via INTRAVENOUS

## 2019-11-06 MED ORDER — ACETAMINOPHEN 500 MG PO TABS
1000.0000 mg | ORAL_TABLET | Freq: Once | ORAL | Status: AC
  Administered 2019-11-06: 1000 mg via ORAL
  Filled 2019-11-06: qty 2

## 2019-11-06 NOTE — Progress Notes (Signed)
Pt is on the phone preparing for virtual visit with provider. [redacted]w[redacted]d. Pt has not gone to pick up her BP cuff yet. Pt reports she is having HA's and blurry vision. Pt reports she is inserting prometrium vaginally nightly for short cervix found on [place].

## 2019-11-06 NOTE — MAU Provider Note (Addendum)
History     CSN: [id number]  Arrival date and time: 11/06/19 1736   First Provider Initiated Contact with Patient 11/06/19 1833      Chief Complaint  Patient presents with  . Headache   HPI Claire Nash is a 23 yo G1P0 at 62.6 EGA who is presenting from her OB office concerning headache that has been ongoing for 3-4 weeks. She has a history of migraines, but reports this does not feel like the headaches she has had before. She does not take medications for the regularly, and has only taken Tylenol once, which did help. It has been constant, right over her eyes and forehead, with spikes of worsening pain intermittently. She endorses occasional photophobia and phonophobia, but nothing persistent. She denies nausea or vomiting. She drinks plenty of water. She says that she only had some blurring of her vision when she ate "Terril," but has not had any sense. She feels like she is looking through somewhat of a fog, but denies flashers or floaters in her vision.  She does not have a BP cuff at home, the office has sent her one, but she has not yet picked it up. She was noted to have elevated BP upon arrival, and looking through her chart she had an elevated BP in MAU 8/21.  OB History    Gravida  1   Para      Term      Preterm      AB      Living        SAB      TAB      Ectopic      Multiple      Live Births              Past Medical History:  Diagnosis Date  . Migraines     Past Surgical History:  Procedure Laterality Date  . NO PAST SURGERIES    . URETHRA SURGERY      Family History  Problem Relation Age of Onset  . Hypertension Mother   . Fibroids Mother   . Hypertension Father   . Asthma Father   . Hypertension Maternal Grandmother   . Drug abuse Maternal Grandmother   . Drug abuse Maternal Grandfather   . Breast cancer Maternal Aunt     Social History   Tobacco Use  . Smoking status: Never Smoker  . Smokeless tobacco: Never Used   Substance Use Topics  . Alcohol use: Not Currently    Comment: occ  . Drug use: Not Currently    Types: Marijuana    Comment: stopped 3 weeks ago    Allergies: No Known Allergies  Medications Prior to Admission  Medication Sig Dispense Refill Last Dose  . Blood Pressure Monitor KIT 1 kit by Does not apply route once a week. 1 kit 0 11/06/2019 at Unknown time  . Prenatal Vit-Fe Fumarate-FA (PRENATAL MULTIVITAMIN) TABS tablet Take 1 tablet by mouth daily at 12 noon.   11/06/2019 at Unknown time  . progesterone (PROMETRIUM) 200 MG capsule Place 1 capsule (200 mg total) vaginally at bedtime. 90 capsule 1 11/05/2019 at Unknown time  . terconazole (TERAZOL 7) 0.4 % vaginal cream Place 1 applicator vaginally at bedtime. (Patient not taking: Reported on 10/09/2019) 45 g 0     Review of Systems  All other systems reviewed and are negative.  Physical Exam   Blood pressure 140/78, pulse 92, temperature 98.2 F (36.8 C), temperature source Oral,  resp. rate 17, weight 93.7 kg, last menstrual period 05/16/2019, SpO2 100 %.  Physical Exam  Nursing note and vitals reviewed. Constitutional: She is oriented to person, place, and time. She appears well-developed and well-nourished.  HENT:  Head: Normocephalic and atraumatic.  Eyes: Pupils are equal, round, and reactive to light. Conjunctivae and EOM are normal.  Neck: Normal range of motion. Neck supple.  Cardiovascular: Normal rate, regular rhythm, normal heart sounds and intact distal pulses.  Respiratory: Effort normal and breath sounds normal.  GI: Soft. Bowel sounds are normal.  Musculoskeletal: Normal range of motion.  Neurological: She is alert and oriented to person, place, and time. She has normal reflexes.  Skin: Skin is warm and dry.  Psychiatric: She has a normal mood and affect. Her behavior is normal. Judgment and thought content normal.    MAU Course  Procedures  MDM -Pre-E labs -HA cocktail  Results for orders placed  or performed during the hospital encounter of 11/06/19 (from the past 24 hour(s))  CBC     Status: Abnormal   Collection Time: 11/06/19  6:45 PM  Result Value Ref Range   WBC 13.3 (H) 4.0 - 10.5 K/uL   RBC 3.46 (L) 3.87 - 5.11 MIL/uL   Hemoglobin 10.6 (L) 12.0 - 15.0 g/dL   HCT 31.6 (L) 36.0 - 46.0 %   MCV 91.3 80.0 - 100.0 fL   MCH 30.6 26.0 - 34.0 pg   MCHC 33.5 30.0 - 36.0 g/dL   RDW 13.3 11.5 - 15.5 %   Platelets 162 150 - 400 K/uL   nRBC 0.0 0.0 - 0.2 %  Comprehensive metabolic panel     Status: Abnormal   Collection Time: 11/06/19  6:45 PM  Result Value Ref Range   Sodium 131 (L) 135 - 145 mmol/L   Potassium 3.2 (L) 3.5 - 5.1 mmol/L   Chloride 102 98 - 111 mmol/L   CO2 20 (L) 22 - 32 mmol/L   Glucose, Bld 87 70 - 99 mg/dL   BUN 7 6 - 20 mg/dL   Creatinine, Ser 0.72 0.44 - 1.00 mg/dL   Calcium 8.3 (L) 8.9 - 10.3 mg/dL   Total Protein 5.9 (L) 6.5 - 8.1 g/dL   Albumin 2.7 (L) 3.5 - 5.0 g/dL   AST 20 15 - 41 U/L   ALT 21 0 - 44 U/L   Alkaline Phosphatase 65 38 - 126 U/L   Total Bilirubin 0.6 0.3 - 1.2 mg/dL   GFR calc non Af Amer >60 >60 mL/min   GFR calc Af Amer >60 >60 mL/min   Anion gap 9 5 - 15  Protein / creatinine ratio, urine     Status: Abnormal   Collection Time: 11/06/19  7:00 PM  Result Value Ref Range   Creatinine, Urine 41.63 mg/dL   Total Protein, Urine 11 mg/dL   Protein Creatinine Ratio 0.26 (H) 0.00 - 0.15 mg/mg[Cre]  Urinalysis, Routine w reflex microscopic     Status: Abnormal   Collection Time: 11/06/19  7:05 PM  Result Value Ref Range   Color, Urine YELLOW YELLOW   APPearance HAZY (A) CLEAR   Specific Gravity, Urine 1.006 1.005 - 1.030   pH 7.0 5.0 - 8.0   Glucose, UA NEGATIVE NEGATIVE mg/dL   Hgb urine dipstick NEGATIVE NEGATIVE   Bilirubin Urine NEGATIVE NEGATIVE   Ketones, ur NEGATIVE NEGATIVE mg/dL   Protein, ur NEGATIVE NEGATIVE mg/dL   Nitrite NEGATIVE NEGATIVE   Leukocytes,Ua TRACE (A) NEGATIVE  RBC / HPF 0-5 0 - 5 RBC/hpf   WBC,  UA 0-5 0 - 5 WBC/hpf   Bacteria, UA FEW (A) NONE SEEN   Squamous Epithelial / LPF 0-5 0 - 5   RECHECK: -Feels much better after HA cocktail, headache is completely gone -BPs have normalized with cessation of her headache -Patient reports no pain -FHR 150s, appropriate for gestational age  Assessment and Plan  23 yo G1P0 at 21.6 EGA presenting for 3-4 weeks of headache -s/p HA cocktail, headache completely gone -Educated on using tylenol at the onset of a headache, mindfulness, and stress mitigation -Pre-E labs normal, elevated BPs likely 2/2 headache -BP cuff was sent today from the office, patient will pick it up, monitor her BP, and call office/return to MAU if it elevates again -Return precautions given  Raegan Sipp L Orvis Stann 11/06/2019, 6:47 PM

## 2019-11-06 NOTE — MAU Note (Signed)
Been having headaches everyday for a while, like a month. Has only taking Tylenol was the day she was seeing double. That was about 2 wks ago. Hx of migraines, this is nothing like. Last night was blurred. Had virtual appt today, was told to come in. Denies epigastric pain or increase in swelling.

## 2019-11-06 NOTE — Progress Notes (Signed)
[facility] VIRTUAL VIDEO VISIT ENCOUNTER NOTE  Provider location: [facility] [place] [place]   I connected with [name] on 11/06/19 at 10:45 AM EST by OB MyChart Video Encounter at home and verified that I am speaking with the correct person using two identifiers.   I discussed the limitations, risks, security and privacy concerns of performing an evaluation and management service virtually and the availability of in person appointments. I also discussed with the patient that there may be a patient responsible charge related to this service. The patient expressed understanding and agreed to proceed. Subjective:  Claire Nash is a 23 y.o. G1P0 at [redacted]w[redacted]d being seen today for ongoing prenatal care.  She is currently monitored for the following issues for this high-risk pregnancy and has [place]; Chronic pelvic pain in female; Back pain; Supervision of normal first pregnancy, antepartum; History of marijuana use; Short cervix affecting pregnancy; Genetic carrier; and Echogenic intracardiac focus of fetus on prenatal ultrasound on their problem list.  Patient reports headache with visual changes ( blurred ).  Contractions: Not present. Vag. Bleeding: None.  Movement: Present. Denies any leaking of fluid.   The following portions of the patient's history were reviewed and updated as appropriate: allergies, current medications, past family history, past medical history, past social history, past surgical history and problem list.   Objective:  There were no vitals filed for this visit.  Fetal Status:     Movement: Present     General:  Alert, oriented and cooperative. Patient is in no acute distress.  Respiratory: Normal respiratory effort, no problems with respiration noted  Mental Status: Normal mood and affect. Normal behavior. Normal judgment and thought content.  Rest of physical exam deferred due to type of encounter  Imaging: [place] Mfm  Ob Transvaginal  Result Date: 10/12/2019 ----------------------------------------------------------------------  OBSTETRICS REPORT                       (Signed Final 10/12/2019 05:01 pm) ---------------------------------------------------------------------- Patient Info  ID #:       [id number]                          D.O.B.:  October 18, 1996 (23 yrs)  Name:       [name]                 Visit Date: 10/12/2019 03:45 pm ---------------------------------------------------------------------- Performed By  Performed By:     Valda Favia          Referred By:      Decorah  Attending:        Johnell Comings MD         [facility] ---------------------------------------------------------------------- Orders   #  Description                          Code  Ordered By   1  US MFM OB TRANSVAGINAL               [id number]      Noralee SpaceAVI SHANKAR  ----------------------------------------------------------------------   #  Order #                    Accession #                 Episode #   1  [id number]                  [phone]                  [id number]  ---------------------------------------------------------------------- Indications   Cervical shortening complicating pregnancy     O26.879   Encounter for cervical length                  Z36.86   Fetal abnormality - other known or             O35.9XX0   suspected (LVEIF)   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)      Z14.8   Marijuana Use - in early pregnancy   [redacted] weeks gestation of pregnancy                Z3A.21  ---------------------------------------------------------------------- Vital Signs                                                 Height:        5'6" ---------------------------------------------------------------------- Fetal Evaluation  Num Of Fetuses:         1  Fetal Heart Rate(bpm):  155  Cardiac Activity:       Observed   Presentation:           Cephalic  Amniotic Fluid  AFI FV:      Within normal limits                              Largest Pocket(cm)                              7 ---------------------------------------------------------------------- OB History  Gravidity:    1 ---------------------------------------------------------------------- Gestational Age  LMP:           21w 2d        Date:  05/16/19                 EDD:   02/20/20  Best:          [place]21w 2d     Det. By:  LMP  (05/16/19)          EDD:   02/20/20 ---------------------------------------------------------------------- Anatomy  Thoracic:              Appears normal         Bladder:                Appears normal  Stomach:               Appears normal, left                         sided ---------------------------------------------------------------------- Cervix Uterus Adnexa  Cervix  Length:  2.8  cm.  Measured transvaginally.  Uterus  No abnormality visualized.  Left Ovary  No adnexal mass visualized.  Right Ovary  No adnexal mass visualized.  Cul De Sac  No free fluid seen.  Adnexa  No abnormality visualized. ---------------------------------------------------------------------- Comments  This patient was seen for a cervical length measurement as a  shortened cervix was noted during her last ultrasound exam.  Due to the sonographically detected shortened cervix, the  patient is currently being treated with daily vaginal  progesterone.  This is her first pregnancy.  The patient denies  any prior cone biopsies or LEEP.  A transvaginal ultrasound performed today showed that her  cervical length was 2.8 cm long without any signs of  funneling.  As the cervical length is stable, the patient was advised to  continue using the daily vaginal progesterone.  Follow-up as indicated. ----------------------------------------------------------------------                   Ma Rings, MD Electronically Signed Final Report   10/12/2019 05:01 pm  ----------------------------------------------------------------------   Assessment and Plan:  Pregnancy: G1P0 at [redacted]w[redacted]d 1. Chronic intractable headache with aura, unspecified headache type - sent to [facility] for evaluation  Preterm labor symptoms and general obstetric precautions including but not limited to vaginal bleeding, contractions, leaking of fluid and fetal movement were reviewed in detail with the patient. I discussed the assessment and treatment plan with the patient. The patient was provided an opportunity to ask questions and all were answered. The patient agreed with the plan and demonstrated an understanding of the instructions. The patient was advised to call back or seek an in-person office evaluation/go to MAU at [facility] for any urgent or concerning symptoms. Please refer to After Visit Summary for other counseling recommendations.   I provided 15 minutes of face-to-face time during this encounter.  Return in about 4 weeks (around 12/04/2019) for [facility], 2 hour OGTT.   Coral Ceo, MD [facility], [facility] 11/06/2019 11:16 AM

## 2019-11-06 NOTE — Discharge Instructions (Signed)
General Headache Without Cause °A headache is pain or discomfort that is felt around the head or neck area. There are many causes and types of headaches. In some cases, the cause may not be found. °Follow these instructions at home: °Watch your condition for any changes. Let your doctor know about them. Take these steps to help with your condition: °Managing pain ° °  ° °· Take over-the-counter and prescription medicines only as told by your doctor. °· Lie down in a dark, quiet room when you have a headache. °· If told, put ice on your head and neck area: °? Put ice in a plastic bag. °? Place a towel between your skin and the bag. °? Leave the ice on for 20 minutes, 2-3 times per day. °· If told, put heat on the affected area. Use the heat source that your doctor recommends, such as a moist heat pack or a heating pad. °? Place a towel between your skin and the heat source. °? Leave the heat on for 20-30 minutes. °? Remove the heat if your skin turns bright red. This is very important if you are unable to feel pain, heat, or cold. You may have a greater risk of getting burned. °· Keep lights dim if bright lights bother you or make your headaches worse. °Eating and drinking °· Eat meals on a regular schedule. °· If you drink alcohol: °? Limit how much you use to: °§ 0-1 drink a day for women. °§ 0-2 drinks a day for men. °? Be aware of how much alcohol is in your drink. In the [place] one drink equals one 12 oz bottle of beer (355 mL), one 5 oz glass of wine (148 mL), or one 1½ oz glass of hard liquor (44 mL). °· Stop drinking caffeine, or reduce how much caffeine you drink. °General instructions ° °· Keep a journal to find out if certain things bring on headaches. For example, write down: °? What you eat and drink. °? How much sleep you get. °? Any change to your diet or medicines. °· Get a massage or try other ways to relax. °· Limit stress. °· Sit up straight. Do not tighten (tense) your muscles. °· Do not use any  products that contain nicotine or tobacco. This includes cigarettes, e-cigarettes, and chewing tobacco. If you need help quitting, ask your doctor. °· Exercise regularly as told by your doctor. °· Get enough sleep. This often means 7-9 hours of sleep each night. °· Keep all follow-up visits as told by your doctor. This is important. °Contact a doctor if: °· Your symptoms are not helped by medicine. °· You have a headache that feels different than the other headaches. °· You feel sick to your stomach (nauseous) or you throw up (vomit). °· You have a fever. °Get help right away if: °· Your headache gets very bad quickly. °· Your headache gets worse after a lot of physical activity. °· You keep throwing up. °· You have a stiff neck. °· You have trouble seeing. °· You have trouble speaking. °· You have pain in the eye or ear. °· Your muscles are weak or you lose muscle control. °· You lose your balance or have trouble walking. °· You feel like you will pass out (faint) or you pass out. °· You are mixed up (confused). °· You have a seizure. °Summary °· A headache is pain or discomfort that is felt around the head or neck area. °· There are many causes and   types of headaches. In some cases, the cause may not be found.  Keep a journal to help find out what causes your headaches. Watch your condition for any changes. Let your doctor know about them.  Contact a doctor if you have a headache that is different from usual, or if your headache is not helped by medicine.  Get help right away if your headache gets very bad, you throw up, you have trouble seeing, you lose your balance, or you have a seizure. This information is not intended to replace advice given to you by your health care provider. Make sure you discuss any questions you have with your health care provider. Document Released: 09/18/2008 Document Revised: 06/30/2018 Document Reviewed: 06/30/2018 Elsevier Patient Education  [place].    Exercise During Pregnancy Exercise is an important part of being healthy for people of all ages. Exercise improves the function of your heart and lungs and helps you maintain strength, flexibility, and a healthy body weight. Exercise also boosts energy levels and elevates mood. Most women should exercise regularly during pregnancy. In rare cases, women with certain medical conditions or complications may be asked to limit or avoid exercise during pregnancy. How does this affect me? Along with maintaining general strength and flexibility, exercising during pregnancy can help:  Keep strength in muscles that are used during labor and childbirth.  Decrease low back pain.  Reduce symptoms of depression.  Control weight gain during pregnancy.  Reduce the risk of needing insulin if you develop diabetes during pregnancy.  Decrease the risk of cesarean delivery.  Speed up your recovery after giving birth. How does this affect my baby? Exercise can help you have a healthy pregnancy. Exercise does not cause premature birth. It will not cause your baby to weigh less at birth. What exercises can I do? Many exercises are safe for you to do during pregnancy. Do a variety of exercises that safely increase your heart and breathing rates and help you build and maintain muscle strength. Do exercises exactly as told by your health care provider. You may do these exercises:  Walking or hiking.  Swimming.  Water aerobics.  Riding a stationary bike.  Strength training.  Modified yoga or Pilates. Tell your instructor that you are pregnant. Avoid overstretching, and avoid lying on your back for long periods of time.  Running or jogging. Only choose this type of exercise if you: ? Ran or jogged regularly before your pregnancy. ? Can run or jog and still talk in complete sentences. What exercises should I avoid? Depending on your level of fitness and whether you exercised regularly before  your pregnancy, you may be told to limit high-intensity exercise. You can tell that you are exercising at a high intensity if you are breathing much harder and faster and cannot hold a conversation while exercising. You must avoid:  Contact sports.  Activities that put you at risk for falling on or being hit in the belly, such as downhill skiing, water skiing, surfing, rock climbing, cycling, gymnastics, and horseback riding.  Scuba diving.  Skydiving.  Yoga or Pilates in a room that is heated to high temperatures.  Jogging or running, unless you ran or jogged regularly before your pregnancy. While jogging or running, you should always be able to talk in full sentences. Do not run or jog so fast that you are unable to have a conversation.  Do not exercise at more than 6,000 feet above sea level (high elevation) if you  are not used to exercising at high elevation. How do I exercise in a safe way?   Avoid overheating. Do not exercise in very high temperatures.  Wear loose-fitting, breathable clothes.  Avoid dehydration. Drink enough water before, during, and after exercise to keep your urine pale yellow.  Avoid overstretching. Because of hormone changes during pregnancy, it is easy to overstretch muscles, tendons, and ligaments during pregnancy.  Start slowly and ask your health care provider to recommend the types of exercise that are safe for you.  Do not exercise to lose weight. Follow these instructions at home:  Exercise on most days or all days of the week. Try to exercise for 30 minutes a day, 5 days a week, unless your health care provider tells you not to.  If you actively exercised before your pregnancy and you are healthy, your health care provider may tell you to continue to do moderate to high-intensity exercise.  If you are just starting to exercise or did not exercise much before your pregnancy, your health care provider may tell you to do low to moderate-intensity  exercise. Questions to ask your health care provider  Is exercise safe for me?  What are signs that I should stop exercising?  Does my health condition mean that I should not exercise during pregnancy?  When should I avoid exercising during pregnancy? Stop exercising and contact a health care provider if: You have any unusual symptoms, such as:  Mild contractions of the uterus or cramps in the abdomen.  Dizziness that does not go away when you rest. Stop exercising and get help right away if: You have any unusual symptoms, such as:  Sudden, severe pain in your low back or your belly.  Mild contractions of the uterus or cramps in the abdomen that do not improve with rest and drinking fluids.  Chest pain.  Bleeding or fluid leaking from your vagina.  Shortness of breath. These symptoms may represent a serious problem that is an emergency. Do not wait to see if the symptoms will go away. Get medical help right away. Call your local emergency services (911 in the [place] Do not drive yourself to the hospital. Summary  Most women should exercise regularly throughout pregnancy. In rare cases, women with certain medical conditions or complications may be asked to limit or avoid exercise during pregnancy.  Do not exercise to lose weight during pregnancy.  Your health care provider will tell you what level of physical activity is right for you.  Stop exercising and contact a health care provider if you have mild contractions of the uterus or cramps in the abdomen. Get help right away if these contractions or cramps do not improve with rest and drinking fluids.  Stop exercising and get help right away if you have sudden, severe pain in your low back or belly, chest pain, shortness of breath, or bleeding or leaking of fluid from your vagina. This information is not intended to replace advice given to you by your health care provider. Make sure you discuss any questions you have with your  health care provider. Document Released: 12/10/2005 Document Revised: 04/02/2019 Document Reviewed: 01/14/2019 Elsevier Patient Education  2020 [employer].   Eating Plan for Pregnant Women While you are pregnant, your body requires additional nutrition to help support your growing baby. You also have a higher need for some vitamins and minerals, such as folic acid, calcium, iron, and vitamin D. Eating a healthy, well-balanced diet is very important for your  health and your baby's health. Your need for extra calories varies for the three 42-month segments of your pregnancy (trimesters). For most women, it is recommended to consume:  150 extra calories a day during the first trimester.  300 extra calories a day during the second trimester.  300 extra calories a day during the third trimester. What are tips for following this plan?   Do not try to lose weight or go on a diet during pregnancy.  Limit your overall intake of foods that have "empty calories." These are foods that have little nutritional value, such as sweets, desserts, candies, and sugar-sweetened beverages.  Eat a variety of foods (especially fruits and vegetables) to get a full range of vitamins and minerals.  Take a prenatal vitamin to help meet your additional vitamin and mineral needs during pregnancy, specifically for folic acid, iron, calcium, and vitamin D.  Remember to stay active. Ask your health care provider what types of exercise and activities are safe for you.  Practice good food safety and cleanliness. Wash your hands before you eat and after you prepare raw meat. Wash all fruits and vegetables well before peeling or eating. Taking these actions can help to prevent food-borne illnesses that can be very dangerous to your baby, such as listeriosis. Ask your health care provider for more information about listeriosis. What does 150 extra calories look like? Healthy options that provide 150 extra calories each day  could be any of the following:  6-8 oz (170-230 g) of plain low-fat yogurt with  cup of berries.  1 apple with 2 teaspoons (11 g) of peanut butter.  Cut-up vegetables with  cup (60 g) of hummus.  8 oz (230 mL) or 1 cup of low-fat chocolate milk.  1 stick of string cheese with 1 medium orange.  1 peanut butter and jelly sandwich that is made with one slice of whole-wheat bread and 1 tsp (5 g) of peanut butter. For 300 extra calories, you could eat two of those healthy options each day. What is a healthy amount of weight to gain? The right amount of weight gain for you is based on your BMI before you became pregnant. If your BMI:  Was less than 18 (underweight), you should gain 28-40 lb (13-18 kg).  Was 18-24.9 (normal), you should gain 25-35 lb (11-16 kg).  Was 25-29.9 (overweight), you should gain 15-25 lb (7-11 kg).  Was 30 or greater (obese), you should gain 11-20 lb (5-9 kg). What if I am having twins or multiples? Generally, if you are carrying twins or multiples:  You may need to eat 300-600 extra calories a day.  The recommended range for total weight gain is 25-54 lb (11-25 kg), depending on your BMI before pregnancy.  Talk with your health care provider to find out about nutritional needs, weight gain, and exercise that is right for you. What foods can I eat?  Grains All grains. Choose whole grains, such as whole-wheat bread, oatmeal, or brown rice. Vegetables All vegetables. Eat a variety of colors and types of vegetables. Remember to wash your vegetables well before peeling or eating. Fruits All fruits. Eat a variety of colors and types of fruit. Remember to wash your fruits well before peeling or eating. Meats and other protein foods Lean meats, including chicken, [place], fish, and lean cuts of beef, veal, or pork. If you eat fish or seafood, choose options that are higher in omega-3 fatty acids and lower in mercury, such as salmon, herring, mussels,  trout,  sardines, pollock, shrimp, crab, and lobster. Tofu. Tempeh. Beans. Eggs. Peanut butter and other nut butters. Make sure that all meats, poultry, and eggs are cooked to food-safe temperatures or "well-done." Two or more servings of fish are recommended each week in order to get the most benefits from omega-3 fatty acids that are found in seafood. Choose fish that are lower in mercury. You can find more information online:  PumpkinSearch.com.eewww.fda.gov Dairy Pasteurized milk and milk alternatives (such as almond milk). Pasteurized yogurt and pasteurized cheese. Cottage cheese. Sour cream. Beverages Water. Juices that contain 100% fruit juice or vegetable juice. Caffeine-free teas and decaffeinated coffee. Drinks that contain caffeine are okay to drink, but it is better to avoid caffeine. Keep your total caffeine intake to less than 200 mg each day (which is 12 oz or 355 mL of coffee, tea, or soda) or the limit as told by your health care provider. Fats and oils Fats and oils are okay to include in moderation. Sweets and desserts Sweets and desserts are okay to include in moderation. Seasoning and other foods All pasteurized condiments. The items listed above may not be a complete list of recommended foods and beverages. Contact your dietitian for more options. The items listed above may not be a complete list of foods and beverages [you/your child] can eat. Contact a dietitian for more information. What foods are not recommended? Vegetables Raw (unpasteurized) vegetable juices. Fruits Unpasteurized fruit juices. Meats and other protein foods Lunch meats, bologna, hot dogs, or other deli meats. (If you must eat those meats, reheat them until they are steaming hot.) Refrigerated pat, meat spreads from a meat counter, smoked seafood that is found in the refrigerated section of a store. Raw or undercooked meats, poultry, and eggs. Raw fish, such as sushi or sashimi. Fish that have high mercury content, such as  tilefish, shark, swordfish, and king mackerel. To learn more about mercury in fish, talk with your health care provider or look for online resources, such as:  PumpkinSearch.com.eewww.fda.gov Dairy Raw (unpasteurized) milk and any foods that have raw milk in them. Soft cheeses, such as feta, queso blanco, queso fresco, Brie, Camembert cheeses, blue-veined cheeses, and Panela cheese (unless it is made with pasteurized milk, which must be stated on the label). Beverages Alcohol. Sugar-sweetened beverages, such as sodas, teas, or energy drinks. Seasoning and other foods Homemade fermented foods and drinks, such as pickles, sauerkraut, or kombucha drinks. (Store-bought pasteurized versions of these are okay.) Salads that are made in a store or deli, such as ham salad, chicken salad, egg salad, tuna salad, and seafood salad. The items listed above may not be a complete list of foods and beverages to avoid. Contact your dietitian for more information. The items listed above may not be a complete list of foods and beverages [you/your child] should avoid. Contact a dietitian for more information. Where to find more information To calculate the number of calories you need based on your height, weight, and activity level, you can use an online calculator such as:  PackageNews.iswww.choosemyplate.gov/MyPlatePlan To calculate how much weight you should gain during pregnancy, you can use an online pregnancy weight gain calculator such as:  http://jones-berg.com/www.choosemyplate.gov/pregnancy-weight-gain-calculator Summary  While you are pregnant, your body requires additional nutrition to help support your growing baby.  Eat a variety of foods, especially fruits and vegetables to get a full range of vitamins and minerals.  Practice good food safety and cleanliness. Wash your hands before you eat and after you prepare raw  meat. Wash all fruits and vegetables well before peeling or eating. Taking these actions can help to prevent food-borne illnesses, such as  listeriosis, that can be very dangerous to your baby.  Do not eat raw meat or fish. Do not eat fish that have high mercury content, such as tilefish, shark, swordfish, and king mackerel. Do not eat unpasteurized (raw) dairy.  Take a prenatal vitamin to help meet your additional vitamin and mineral needs during pregnancy, specifically for folic acid, iron, calcium, and vitamin D. This information is not intended to replace advice given to you by your health care provider. Make sure you discuss any questions you have with your health care provider. Document Released: 09/24/2014 Document Revised: 04/02/2019 Document Reviewed: 09/06/2017 Elsevier Patient Education  2020 [employer].

## 2019-11-08 LAB — URINE CULTURE

## 2019-11-16 ENCOUNTER — Inpatient Hospital Stay (HOSPITAL_COMMUNITY)
Admission: AD | Admit: 2019-11-16 | Discharge: 2019-11-16 | Disposition: A | Discharge status: Home / Self Care | Payer: Medicaid Other | Attending: Obstetrics and Gynecology | Admitting: Obstetrics and Gynecology

## 2019-11-16 ENCOUNTER — Encounter (HOSPITAL_COMMUNITY): Payer: Self-pay

## 2019-11-16 ENCOUNTER — Other Ambulatory Visit: Payer: Self-pay

## 2019-11-16 DIAGNOSIS — O1492 Unspecified pre-eclampsia, second trimester: Secondary | ICD-10-CM | POA: Diagnosis present

## 2019-11-16 DIAGNOSIS — G43909 Migraine, unspecified, not intractable, without status migrainosus: Secondary | ICD-10-CM | POA: Diagnosis not present

## 2019-11-16 DIAGNOSIS — O1493 Unspecified pre-eclampsia, third trimester: Secondary | ICD-10-CM | POA: Diagnosis present

## 2019-11-16 DIAGNOSIS — Z3A26 26 weeks gestation of pregnancy: Secondary | ICD-10-CM | POA: Insufficient documentation

## 2019-11-16 DIAGNOSIS — G43009 Migraine without aura, not intractable, without status migrainosus: Secondary | ICD-10-CM

## 2019-11-16 DIAGNOSIS — O99352 Diseases of the nervous system complicating pregnancy, second trimester: Secondary | ICD-10-CM | POA: Diagnosis not present

## 2019-11-16 DIAGNOSIS — Z79899 Other long term (current) drug therapy: Secondary | ICD-10-CM | POA: Diagnosis not present

## 2019-11-16 HISTORY — DX: Incompetence of cervix uteri: N88.3

## 2019-11-16 LAB — COMPREHENSIVE METABOLIC PANEL
ALT: 14 U/L (ref 0–44)
AST: 29 U/L (ref 15–41)
Albumin: 2.6 g/dL — ABNORMAL LOW (ref 3.5–5.0)
Alkaline Phosphatase: 81 U/L (ref 38–126)
Anion gap: 7 (ref 5–15)
BUN: 6 mg/dL (ref 6–20)
CO2: 18 mmol/L — ABNORMAL LOW (ref 22–32)
Calcium: 8.8 mg/dL — ABNORMAL LOW (ref 8.9–10.3)
Chloride: 110 mmol/L (ref 98–111)
Creatinine, Ser: 0.71 mg/dL (ref 0.44–1.00)
GFR calc Af Amer: >60 mL/min (ref >60)
GFR calc non Af Amer: >60 mL/min (ref >60)
Glucose, Bld: 68 mg/dL — ABNORMAL LOW (ref 70–99)
Potassium: 4.3 mmol/L (ref 3.5–5.1)
Sodium: 135 mmol/L (ref 135–145)
Total Bilirubin: 1 mg/dL (ref 0.3–1.2)
Total Protein: 5.2 g/dL — ABNORMAL LOW (ref 6.5–8.1)

## 2019-11-16 LAB — URINALYSIS, ROUTINE W REFLEX MICROSCOPIC
Bilirubin Urine: NEGATIVE
Glucose, UA: 50 mg/dL — AB
Hgb urine dipstick: NEGATIVE
Ketones, ur: NEGATIVE mg/dL
Nitrite: NEGATIVE
Protein, ur: NEGATIVE mg/dL
Specific Gravity, Urine: 1.01 (ref 1.005–1.030)
pH: 7 (ref 5.0–8.0)

## 2019-11-16 LAB — PROTEIN / CREATININE RATIO, URINE
Creatinine, Urine: 63.88 mg/dL
Protein Creatinine Ratio: 0.3 mg/mg{Cre} — ABNORMAL HIGH (ref 0.00–0.15)
Total Protein, Urine: 19 mg/dL

## 2019-11-16 LAB — CBC
HCT: 32.8 % — ABNORMAL LOW (ref 36.0–46.0)
Hemoglobin: 10.8 g/dL — ABNORMAL LOW (ref 12.0–15.0)
MCH: 30.5 pg (ref 26.0–34.0)
MCHC: 32.9 g/dL (ref 30.0–36.0)
MCV: 92.7 fL (ref 80.0–100.0)
Platelets: 179 10*3/uL (ref 150–400)
RBC: 3.54 MIL/uL — ABNORMAL LOW (ref 3.87–5.11)
RDW: 13.4 % (ref 11.5–15.5)
WBC: 12.6 10*3/uL — ABNORMAL HIGH (ref 4.0–10.5)
nRBC: 0 % (ref 0.0–0.2)

## 2019-11-16 MED ORDER — METOCLOPRAMIDE HCL 5 MG/ML IJ SOLN
10.0000 mg | Freq: Once | INTRAMUSCULAR | Status: AC
  Administered 2019-11-16: 10 mg via INTRAVENOUS
  Filled 2019-11-16: qty 2

## 2019-11-16 MED ORDER — DIPHENHYDRAMINE HCL 50 MG/ML IJ SOLN
12.5000 mg | Freq: Once | INTRAMUSCULAR | Status: AC
  Administered 2019-11-16: 12.5 mg via INTRAVENOUS
  Filled 2019-11-16: qty 1

## 2019-11-16 MED ORDER — DEXAMETHASONE SODIUM PHOSPHATE 10 MG/ML IJ SOLN
10.0000 mg | Freq: Once | INTRAMUSCULAR | Status: AC
  Administered 2019-11-16: 10 mg via INTRAVENOUS
  Filled 2019-11-16: qty 1

## 2019-11-16 MED ORDER — CYCLOBENZAPRINE HCL 10 MG PO TABS: 10.0000 mg | ORAL_TABLET | Freq: Three times a day (TID) | ORAL | 0 refills | Status: DC | PRN

## 2019-11-16 MED ORDER — BUTALBITAL-APAP-CAFFEINE 50-325-40 MG PO TABS: 1.0000 | ORAL_TABLET | Freq: Four times a day (QID) | ORAL | 0 refills | Status: DC | PRN

## 2019-11-16 MED ORDER — LACTATED RINGERS IV BOLUS
1000.0000 mL | Freq: Once | INTRAVENOUS | Status: AC
  Administered 2019-11-16: 1000 mL via INTRAVENOUS

## 2019-11-16 NOTE — MAU Provider Note (Signed)
History     CSN: [id number]  Arrival date and time: 11/16/19 1114   First Provider Initiated Contact with Patient 11/16/19 1236      Chief Complaint  Patient presents with  . Blurred Vision  . Headache   HPI   Claire Nash is a 23 y.o.G1P0 _0  female with a long standing history of migraines headache. Patient's mother is present today and says her daughter started having migraines at age 29. She has never seen a neurologist but see's PCP. She has photophobia associated with the migraine. The only medication she is taking for relief is 500 mg of tylenol which is not working. No scotoma. Recently diagnosed with Pre E.   OB History    Gravida  1   Para      Term      Preterm      AB      Living        SAB      TAB      Ectopic      Multiple      Live Births              Past Medical History:  Diagnosis Date  . Migraines   . Short cervix     Past Surgical History:  Procedure Laterality Date  . NO PAST SURGERIES    . URETHRA SURGERY      Family History  Problem Relation Age of Onset  . Hypertension Mother   . Fibroids Mother   . Hypertension Father   . Asthma Father   . Hypertension Maternal Grandmother   . Drug abuse Maternal Grandmother   . Drug abuse Maternal Grandfather   . Breast cancer Maternal Aunt     Social History   Tobacco Use  . Smoking status: Never Smoker  . Smokeless tobacco: Never Used  Substance Use Topics  . Alcohol use: Not Currently    Comment: occ  . Drug use: Not Currently    Types: Marijuana    Comment: Last use June 2020    Allergies: No Known Allergies  Medications Prior to Admission  Medication Sig Dispense Refill Last Dose  . Blood Pressure Monitor KIT 1 kit by Does not apply route once a week. 1 kit 0   . Prenatal Vit-Fe Fumarate-FA (PRENATAL MULTIVITAMIN) TABS tablet Take 1 tablet by mouth daily at 12 noon.     . progesterone (PROMETRIUM) 200 MG capsule Place 1 capsule (200 mg total) vaginally  at bedtime. 90 capsule 1   . terconazole (TERAZOL 7) 0.4 % vaginal cream Place 1 applicator vaginally at bedtime. (Patient not taking: Reported on 10/09/2019) 45 g 0    Results for orders placed or performed during the hospital encounter of 11/16/19 (from the past 48 hour(s))  Urinalysis, Routine w reflex microscopic     Status: Abnormal   Collection Time: 11/16/19 12:02 PM  Result Value Ref Range   Color, Urine YELLOW YELLOW   APPearance HAZY (A) CLEAR   Specific Gravity, Urine 1.010 1.005 - 1.030   pH 7.0 5.0 - 8.0   Glucose, UA 50 (A) NEGATIVE mg/dL   Hgb urine dipstick NEGATIVE NEGATIVE   Bilirubin Urine NEGATIVE NEGATIVE   Ketones, ur NEGATIVE NEGATIVE mg/dL   Protein, ur NEGATIVE NEGATIVE mg/dL   Nitrite NEGATIVE NEGATIVE   Leukocytes,Ua TRACE (A) NEGATIVE   RBC / HPF 0-5 0 - 5 RBC/hpf   WBC, UA 0-5 0 - 5 WBC/hpf   Bacteria, UA RARE (A)  NONE SEEN   Squamous Epithelial / LPF 0-5 0 - 5   Mucus PRESENT     Comment: Performed at [facility], [place] [address] [place], [place] [address]  CBC     Status: Abnormal   Collection Time: 11/16/19  2:39 PM  Result Value Ref Range   WBC 12.6 (H) 4.0 - 10.5 K/uL   RBC 3.54 (L) 3.87 - 5.11 MIL/uL   Hemoglobin 10.8 (L) 12.0 - 15.0 g/dL   HCT 32.8 (L) 36.0 - 46.0 %   MCV 92.7 80.0 - 100.0 fL   MCH 30.5 26.0 - 34.0 pg   MCHC 32.9 30.0 - 36.0 g/dL   RDW 13.4 11.5 - 15.5 %   Platelets 179 150 - 400 K/uL   nRBC 0.0 0.0 - 0.2 %    Comment: Performed at [facility], [place] [address] [place], [place] [address]  Comprehensive metabolic panel     Status: Abnormal   Collection Time: 11/16/19  2:39 PM  Result Value Ref Range   Sodium 135 135 - 145 mmol/L   Potassium 4.3 3.5 - 5.1 mmol/L    Comment: SPECIMEN HEMOLYZED. HEMOLYSIS MAY AFFECT INTEGRITY OF RESULTS.   Chloride 110 98 - 111 mmol/L   CO2 18 (L) 22 - 32 mmol/L   Glucose, Bld 68 (L) 70 - 99 mg/dL   BUN 6 6 - 20 mg/dL   Creatinine, Ser 0.71 0.44 - 1.00 mg/dL   Calcium 8.8  (L) 8.9 - 10.3 mg/dL   Total Protein 5.2 (L) 6.5 - 8.1 g/dL   Albumin 2.6 (L) 3.5 - 5.0 g/dL   AST 29 15 - 41 U/L   ALT 14 0 - 44 U/L   Alkaline Phosphatase 81 38 - 126 U/L   Total Bilirubin 1.0 0.3 - 1.2 mg/dL   GFR calc non Af Amer >60 >60 mL/min   GFR calc Af Amer >60 >60 mL/min   Anion gap 7 5 - 15    Comment: Performed at [facility] [address], [place], [place] [address]  Protein / creatinine ratio, urine     Status: Abnormal   Collection Time: 11/16/19  2:50 PM  Result Value Ref Range   Creatinine, Urine 63.88 mg/dL   Total Protein, Urine 19 mg/dL    Comment: NO NORMAL RANGE ESTABLISHED FOR THIS TEST   Protein Creatinine Ratio 0.30 (H) 0.00 - 0.15 mg/mg[Cre]    Comment: Performed at [facility] [address], [place], [place] [address]    Review of Systems  Eyes: Positive for photophobia. Negative for visual disturbance.  Cardiovascular: Negative for leg swelling.  Gastrointestinal: Negative for abdominal pain, nausea and vomiting.  Neurological: Positive for headaches.   Physical Exam   Blood pressure 132/80, pulse 82, temperature 98 F (36.7 C), temperature source Oral, resp. rate 16, height _0  (1.702 m), weight 94.6 kg, last menstrual period 05/16/2019, SpO2 100 %.   Patient Vitals for the past 24 hrs:  BP Temp Temp src Pulse Resp SpO2 Height Weight  11/16/19 1531 126/78 - - 85 - - - -  11/16/19 1516 127/77 - - 87 - - - -  11/16/19 1501 128/77 - - 86 - - - -  11/16/19 1446 133/71 - - 85 - - - -  11/16/19 1430 124/77 - - 86 - - - -  11/16/19 1345 131/82 - - 90 - - - -  11/16/19 1336 127/81 - - 87 - - - -  11/16/19 1316 133/76 - - 79 - - - -  11/16/19 1301 135/89 - - 79 - - - -  11/16/19 1250 130/84 - - 78 - - - -  11/16/19 1231 128/80 - - 79 - - - -  11/16/19 1216 132/80 - - 82 - - - -  11/16/19 1207 (!) 142/87 - - 82 - - - -  11/16/19 1144 130/87 98 F (36.7 C) Oral 82 16 100 % _0  (1.702 m) 94.6 kg   Physical Exam   Constitutional: She is oriented to person, place, and time. She appears well-developed and well-nourished. No distress.  HENT:  Head: Normocephalic.  Eyes: Pupils are equal, round, and reactive to light.  Musculoskeletal: Normal range of motion.        General: No tenderness or edema.  Neurological: She is alert and oriented to person, place, and time. She displays normal reflexes.  Skin: Skin is warm. She is not diaphoretic.  Psychiatric: Her behavior is normal.   Fetal Tracing: Baseline: 140 bpm Variability: Moderate  Accelerations: 10x10 Decelerations: None Toco: None  MAU Course  Procedures  None  MDM  One elevated BP: PIH labs ordered  Headache cocktail given: reglan, decadron, benadryl  HA pain down from 8/10 to 2/10 Discussed plan with Dr. Rip Harbour   Assessment and Plan   A:  1. Preeclampsia, third trimester   2. Migraine without aura and without status migrainosus, not intractable     P:  Discharge home with strict return precautions F/u in the office on Wednesday for BP check Pre-E precautions She should see Claire Nash at [facility] for migraine management. She may need neurology referral Rx: Flexeril, Fioricet OTC tylenol ok as directed on the bottle  Jie Stickels, Artist Pais, NP 11/18/2019 9:29 AM

## 2019-11-16 NOTE — Discharge Instructions (Signed)
[facility]:  [place], [place]

## 2019-11-16 NOTE — MAU Note (Signed)
Pt reports some spotty vision and "glares" around 930 this morning. She has had blurry vision for about a week.   Also reports getting a headache. Has had headaches for more than a week. Has hx of migraines.  Was evaluated in MAU on 11/13 for similar symptoms. She tried eating something. She is here because she also  reports trying to read something and feeling like she couldn't concentrate or express herself. Also felt light right arm and finger were numb. Had also felt her hands were swollen. Still has headache and blurry vision. The numbness has resolved. She is able to concentrate now.  Has not taken anything. Denies LOF, VB, +FM.

## 2019-11-18 ENCOUNTER — Other Ambulatory Visit: Payer: Self-pay

## 2019-11-18 ENCOUNTER — Ambulatory Visit: Payer: Medicaid Other

## 2019-11-18 VITALS — BP 130/80 | HR 83

## 2019-11-18 DIAGNOSIS — Z0130 Encounter for examination of blood pressure without abnormal findings: Secondary | ICD-10-CM

## 2019-11-18 NOTE — Progress Notes (Signed)
..  Subjective:  Claire Nash is a 23 y.o. female here for BP check.   Hypertension ROS:  Patient is currently not taking any BP meds, dx with preeclampsia on 11-16-19. Patient denies any abnormal symptoms today.  Objective:  BP 130/80   Pulse 83   LMP 05/16/2019 (Exact Date)   Appearance alert, well appearing, and in no distress. General exam BP noted to be well controlled today in office.    Assessment:   Blood Pressure stable.   Plan:  Current treatment plan is effective, no change in therapy.Marland Kitchen Next appt 12-02-19.

## 2019-11-20 NOTE — Progress Notes (Signed)
Patient seen and assessed by nursing staff during this encounter. I have reviewed the chart and agree with the documentation and plan.  Maryann Conners, CNM 11/20/2019 1:32 PM

## 2019-12-02 ENCOUNTER — Encounter: Payer: Self-pay | Admitting: Obstetrics and Gynecology

## 2019-12-02 ENCOUNTER — Other Ambulatory Visit: Payer: Medicaid Other

## 2019-12-02 ENCOUNTER — Other Ambulatory Visit: Payer: Self-pay

## 2019-12-02 ENCOUNTER — Ambulatory Visit (INDEPENDENT_AMBULATORY_CARE_PROVIDER_SITE_OTHER): Payer: Medicaid Other | Admitting: Obstetrics and Gynecology

## 2019-12-02 VITALS — BP 127/81 | HR 92 | Wt 212.0 lb

## 2019-12-02 DIAGNOSIS — Z23 Encounter for immunization: Secondary | ICD-10-CM | POA: Diagnosis not present

## 2019-12-02 DIAGNOSIS — O26873 Cervical shortening, third trimester: Secondary | ICD-10-CM

## 2019-12-02 DIAGNOSIS — Z148 Genetic carrier of other disease: Secondary | ICD-10-CM

## 2019-12-02 DIAGNOSIS — O1493 Unspecified pre-eclampsia, third trimester: Secondary | ICD-10-CM

## 2019-12-02 DIAGNOSIS — O26879 Cervical shortening, unspecified trimester: Secondary | ICD-10-CM

## 2019-12-02 DIAGNOSIS — O283 Abnormal ultrasonic finding on antenatal screening of mother: Secondary | ICD-10-CM

## 2019-12-02 DIAGNOSIS — Z3400 Encounter for supervision of normal first pregnancy, unspecified trimester: Secondary | ICD-10-CM

## 2019-12-02 DIAGNOSIS — Z3A28 28 weeks gestation of pregnancy: Secondary | ICD-10-CM

## 2019-12-02 NOTE — Patient Instructions (Signed)
Preeclampsia and Eclampsia °Preeclampsia is a serious condition that may develop during pregnancy. This condition causes high blood pressure and increased protein in your urine along with other symptoms, such as headaches and vision changes. These symptoms may develop as the condition gets worse. Preeclampsia may occur at 20 weeks of pregnancy or later. °Diagnosing and treating preeclampsia early is very important. If not treated early, it can cause serious problems for you and your baby. One problem it can lead to is eclampsia. Eclampsia is a condition that causes muscle jerking or shaking (convulsions or seizures) and other serious problems for the mother. During pregnancy, delivering your baby may be the best treatment for preeclampsia or eclampsia. For most women, preeclampsia and eclampsia symptoms go away after giving birth. °In rare cases, a woman may develop preeclampsia after giving birth (postpartum preeclampsia). This usually occurs within 48 hours after childbirth but may occur up to 6 weeks after giving birth. °What are the causes? °The cause of preeclampsia is not known. °What increases the risk? °The following risk factors make you more likely to develop preeclampsia: °· Being pregnant for the first time. °· Having had preeclampsia during a past pregnancy. °· Having a family history of preeclampsia. °· Having high blood pressure. °· Being pregnant with more than one baby. °· Being 35 or older. °· Being African-American. °· Having kidney disease or diabetes. °· Having medical conditions such as lupus or blood diseases. °· Being very overweight (obese). °What are the signs or symptoms? °The most common symptoms are: °· Severe headaches. °· Vision problems, such as blurred or double vision. °· Abdominal pain, especially upper abdominal pain. °Other symptoms that may develop as the condition gets worse include: °· Sudden weight gain. °· Sudden swelling of the hands, face, legs, and feet. °· Severe nausea  and vomiting. °· Numbness in the face, arms, legs, and feet. °· Dizziness. °· Urinating less than usual. °· Slurred speech. °· Convulsions or seizures. °How is this diagnosed? °There are no screening tests for preeclampsia. Your health care provider will ask you about symptoms and check for signs of preeclampsia during your prenatal visits. You may also have tests that include: °· Checking your blood pressure. °· Urine tests to check for protein. Your health care provider will check for this at every prenatal visit. °· Blood tests. °· Monitoring your baby's heart rate. °· Ultrasound. °How is this treated? °You and your health care provider will determine the treatment approach that is best for you. Treatment may include: °· Having more frequent prenatal exams to check for signs of preeclampsia, if you have an increased risk for preeclampsia. °· Medicine to lower your blood pressure. °· Staying in the hospital, if your condition is severe. There, treatment will focus on controlling your blood pressure and the amount of fluids in your body (fluid retention). °· Taking medicine (magnesium sulfate) to prevent seizures. This may be given as an injection or through an IV. °· Taking a low-dose aspirin during your pregnancy. °· Delivering your baby early. You may have your labor started with medicine (induced), or you may have a cesarean delivery. °Follow these instructions at home: °Eating and drinking ° °· Drink enough fluid to keep your urine pale yellow. °· Avoid caffeine. °Lifestyle °· Do not use any products that contain nicotine or tobacco, such as cigarettes and e-cigarettes. If you need help quitting, ask your health care provider. °· Do not use alcohol or drugs. °· Avoid stress as much as possible. Rest and get   plenty of sleep. °General instructions °· Take over-the-counter and prescription medicines only as told by your health care provider. °· When lying down, lie on your left side. This keeps pressure off your  major blood vessels. °· When sitting or lying down, raise (elevate) your feet. Try putting some pillows underneath your lower legs. °· Exercise regularly. Ask your health care provider what kinds of exercise are best for you. °· Keep all follow-up and prenatal visits as told by your health care provider. This is important. °How is this prevented? °There is no known way of preventing preeclampsia or eclampsia from developing. However, to lower your risk of complications and detect problems early: °· Get regular prenatal care. Your health care provider may be able to diagnose and treat the condition early. °· Maintain a healthy weight. Ask your health care provider for help managing weight gain during pregnancy. °· Work with your health care provider to manage any long-term (chronic) health conditions you have, such as diabetes or kidney problems. °· You may have tests of your blood pressure and kidney function after giving birth. °· Your health care provider may have you take low-dose aspirin during your next pregnancy. °Contact a health care provider if: °· You have symptoms that your health care provider told you may require more treatment or monitoring, such as: °? Headaches. °? Nausea or vomiting. °? Abdominal pain. °? Dizziness. °? Light-headedness. °Get help right away if: °· You have severe: °? Abdominal pain. °? Headaches that do not get better. °? Dizziness. °? Vision problems. °? Confusion. °? Nausea or vomiting. °· You have any of the following: °? A seizure. °? Sudden, rapid weight gain. °? Sudden swelling in your hands, ankles, or face. °? Trouble moving any part of your body. °? Numbness in any part of your body. °? Trouble speaking. °? Abnormal bleeding. °· You faint. °Summary °· Preeclampsia is a serious condition that may develop during pregnancy. °· This condition causes high blood pressure and increased protein in your urine along with other symptoms, such as headaches and vision  changes. °· Diagnosing and treating preeclampsia early is very important. If not treated early, it can cause serious problems for you and your baby. °· Get help right away if you have symptoms that your health care provider told you to watch for. °This information is not intended to replace advice given to you by your health care provider. Make sure you discuss any questions you have with your health care provider. °Document Released: 12/07/2000 Document Revised: 08/12/2018 Document Reviewed: 07/16/2016 °Elsevier Patient Education © 2020 Else[employer] Inc. ° °

## 2019-12-02 NOTE — Progress Notes (Signed)
Subjective:  Claire Nash is a 23 y.o. G1P0 at [redacted]w[redacted]d being seen today for ongoing prenatal care.  She is currently monitored for the following issues for this high-risk pregnancy and has [place]; Back pain; Supervision of normal first pregnancy, antepartum; History of marijuana use; Short cervix affecting pregnancy; Genetic carrier; Echogenic intracardiac focus of fetus on prenatal ultrasound; and Preeclampsia, third trimester on their problem list.  Patient reports no complaints.  Contractions: Not present. Vag. Bleeding: None.  Movement: Present. Denies leaking of fluid.   The following portions of the patient's history were reviewed and updated as appropriate: allergies, current medications, past family history, past medical history, past social history, past surgical history and problem list. Problem list updated.  Objective:   Vitals:   12/02/19 0856  BP: 127/81  Pulse: 92  Weight: 212 lb (96.2 kg)    Fetal Status:     Movement: Present     General:  Alert, oriented and cooperative. Patient is in no acute distress.  Skin: Skin is warm and dry. No rash noted.   Cardiovascular: Normal heart rate noted  Respiratory: Normal respiratory effort, no problems with respiration noted  Abdomen: Soft, gravid, appropriate for gestational age. Pain/Pressure: Absent     Pelvic:  Cervical exam deferred        Extremities: Normal range of motion.  Edema: None  Mental Status: Normal mood and affect. Normal behavior. Normal judgment and thought content.   Urinalysis:      Assessment and Plan:  Pregnancy: G1P0 at [redacted]w[redacted]d  1. Supervision of normal first pregnancy, antepartum Stable - Tdap vaccine greater than or equal to 7yo IM - Glucose Tolerance, 2 Hours w/1 Hour - CBC - HIV antibody (with reflex) - RPR  2. Short cervix affecting pregnancy Stable CL 2.8 on U/S 10/12/19 Continue with vaginal progesterone til 36-37 weeks  3. Preeclampsia, third trimester Dx reviewed with  pt. BP monitoring at home reviewed - [place] MFM OB FOLLOW UP; Future  4. Genetic carrier Stable  5. Echogenic intracardiac focus of fetus on prenatal ultrasound Stable  Preterm labor symptoms and general obstetric precautions including but not limited to vaginal bleeding, contractions, leaking of fluid and fetal movement were reviewed in detail with the patient. Please refer to After Visit Summary for other counseling recommendations.  Return in about 2 weeks (around 12/16/2019) for OB visit, face to face, MD provider.   Chancy Milroy, MD

## 2019-12-03 LAB — RPR: RPR Ser Ql: NONREACTIVE

## 2019-12-03 LAB — HIV ANTIBODY (ROUTINE TESTING W REFLEX): HIV Screen 4th Generation wRfx: NONREACTIVE

## 2019-12-03 LAB — CBC
Hematocrit: 32.2 % — ABNORMAL LOW (ref 34.0–46.6)
Hemoglobin: 10.6 g/dL — ABNORMAL LOW (ref 11.1–15.9)
MCH: 29.8 pg (ref 26.6–33.0)
MCHC: 32.9 g/dL (ref 31.5–35.7)
MCV: 90 fL (ref 79–97)
Platelets: 178 10*3/uL (ref 150–450)
RBC: 3.56 x10E6/uL — ABNORMAL LOW (ref 3.77–5.28)
RDW: 12.6 % (ref 11.7–15.4)
WBC: 10.7 10*3/uL (ref 3.4–10.8)

## 2019-12-03 LAB — GLUCOSE TOLERANCE, 2 HOURS W/ 1HR
Glucose, 1 hour: 112 mg/dL (ref 65–179)
Glucose, 2 hour: 87 mg/dL (ref 65–152)
Glucose, Fasting: 78 mg/dL (ref 65–91)

## 2019-12-04 ENCOUNTER — Encounter: Payer: Medicaid Other | Admitting: Medical

## 2019-12-04 ENCOUNTER — Other Ambulatory Visit: Payer: Medicaid Other

## 2019-12-16 ENCOUNTER — Ambulatory Visit (HOSPITAL_COMMUNITY): Payer: Medicaid Other | Admitting: *Deleted

## 2019-12-16 ENCOUNTER — Ambulatory Visit (HOSPITAL_COMMUNITY)
Admission: RE | Admit: 2019-12-16 | Discharge: 2019-12-16 | Disposition: A | Discharge status: Home / Self Care | Payer: Medicaid Other | Source: Ambulatory Visit | Attending: Obstetrics and Gynecology | Admitting: Obstetrics and Gynecology

## 2019-12-16 ENCOUNTER — Telehealth: Payer: Medicaid Other | Admitting: Obstetrics and Gynecology

## 2019-12-16 ENCOUNTER — Other Ambulatory Visit: Payer: Self-pay

## 2019-12-16 ENCOUNTER — Encounter (HOSPITAL_COMMUNITY): Payer: Self-pay

## 2019-12-16 ENCOUNTER — Other Ambulatory Visit (HOSPITAL_COMMUNITY): Payer: Self-pay | Admitting: *Deleted

## 2019-12-16 DIAGNOSIS — Z87898 Personal history of other specified conditions: Secondary | ICD-10-CM

## 2019-12-16 DIAGNOSIS — Z362 Encounter for other antenatal screening follow-up: Secondary | ICD-10-CM

## 2019-12-16 DIAGNOSIS — O1493 Unspecified pre-eclampsia, third trimester: Secondary | ICD-10-CM

## 2019-12-16 DIAGNOSIS — Z3A30 30 weeks gestation of pregnancy: Secondary | ICD-10-CM

## 2019-12-16 DIAGNOSIS — O359XX0 Maternal care for (suspected) fetal abnormality and damage, unspecified, not applicable or unspecified: Secondary | ICD-10-CM

## 2019-12-16 DIAGNOSIS — O26879 Cervical shortening, unspecified trimester: Secondary | ICD-10-CM | POA: Insufficient documentation

## 2019-12-16 DIAGNOSIS — O283 Abnormal ultrasonic finding on antenatal screening of mother: Secondary | ICD-10-CM

## 2019-12-16 DIAGNOSIS — O3432 Maternal care for cervical incompetence, second trimester: Secondary | ICD-10-CM

## 2019-12-16 DIAGNOSIS — Z148 Genetic carrier of other disease: Secondary | ICD-10-CM

## 2019-12-16 DIAGNOSIS — O26873 Cervical shortening, third trimester: Secondary | ICD-10-CM

## 2019-12-16 DIAGNOSIS — Z3A22 22 weeks gestation of pregnancy: Secondary | ICD-10-CM | POA: Diagnosis not present

## 2019-12-16 DIAGNOSIS — F1291 Cannabis use, unspecified, in remission: Secondary | ICD-10-CM

## 2019-12-25 NOTE — L&D Delivery Note (Addendum)
OB/GYN Faculty Practice Delivery Note  Claire Nash is a 24 y.o. G1P0 at [redacted]w[redacted]d. She was admitted for IOL d/t PEC w/o SF.    ROM: 5h 72m with clear fluid GBS Status: negative Maximum Maternal Temperature: 98.7*F  Labor Progress: . Patient admitted with CVE closed, long, high, she received one dose of cytotec and a foley bulb and CVE changed to 5/70/-2. Pitocin started and CVE increased to 6.5/70/-2. Patient AROMed with continued pitocin titration. Patient had recurrent late decels, pitocin cut in half. Strip improved and pitocin titration restarted. Patient then progressed to complete.   Delivery Date/Time: 02/03/2020 at 1909 Delivery: Called to room and patient was complete and pushing. Head delivered ROA. Nuchal cord x1 present, shoulder and body delivered through cord using somersault maneuver. Infant with spontaneous cry, placed on mother's abdomen, dried and stimulated. Cord clamped x 2 after 1-minute delay, and cut by FOB. Cord blood drawn. Placenta delivered spontaneously with gentle cord traction. Fundus firm with massage and Pitocin. Labia, perineum, vagina, and cervix inspected with bilateral periurethral lacerations and a first degree perineal laceration which were repaired with 3-0 Vicryl in a standard fashion.  Placenta: 3VC, intact, to L&D Complications: none Lacerations: bilateral periurethral, first degree perineum EBL: 725 mL Analgesia: epidural  Infant: female  APGARs 8,9  weight pending per medical chart  Shirlean Mylar, MD [facility], PGY-1 02/03/2020, 7:51 PM  OB FELLOW DELIVERY ATTESTATION  I was gloved and present for the delivery in its entirety, and I agree with the above resident's note.    Jerilynn Birkenhead, MD [facility] Fellow, [facility] for [employer], [facility]

## 2020-01-06 ENCOUNTER — Ambulatory Visit (HOSPITAL_COMMUNITY)
Admission: RE | Admit: 2020-01-06 | Discharge: 2020-01-06 | Disposition: A | Discharge status: Home / Self Care | Payer: Medicaid Other | Source: Ambulatory Visit | Attending: Obstetrics and Gynecology | Admitting: Obstetrics and Gynecology

## 2020-01-06 ENCOUNTER — Other Ambulatory Visit (HOSPITAL_COMMUNITY): Payer: Self-pay | Admitting: *Deleted

## 2020-01-06 ENCOUNTER — Ambulatory Visit (HOSPITAL_COMMUNITY): Payer: Medicaid Other | Admitting: *Deleted

## 2020-01-06 ENCOUNTER — Other Ambulatory Visit: Payer: Self-pay

## 2020-01-06 ENCOUNTER — Encounter (HOSPITAL_COMMUNITY): Payer: Self-pay | Admitting: *Deleted

## 2020-01-06 DIAGNOSIS — Z148 Genetic carrier of other disease: Secondary | ICD-10-CM | POA: Insufficient documentation

## 2020-01-06 DIAGNOSIS — O283 Abnormal ultrasonic finding on antenatal screening of mother: Secondary | ICD-10-CM

## 2020-01-06 DIAGNOSIS — O1493 Unspecified pre-eclampsia, third trimester: Secondary | ICD-10-CM | POA: Diagnosis present

## 2020-01-06 DIAGNOSIS — F1291 Cannabis use, unspecified, in remission: Secondary | ICD-10-CM

## 2020-01-06 DIAGNOSIS — O26879 Cervical shortening, unspecified trimester: Secondary | ICD-10-CM

## 2020-01-06 DIAGNOSIS — Z87898 Personal history of other specified conditions: Secondary | ICD-10-CM

## 2020-01-06 DIAGNOSIS — Z362 Encounter for other antenatal screening follow-up: Secondary | ICD-10-CM | POA: Insufficient documentation

## 2020-01-06 DIAGNOSIS — O26873 Cervical shortening, third trimester: Secondary | ICD-10-CM

## 2020-01-06 DIAGNOSIS — O359XX0 Maternal care for (suspected) fetal abnormality and damage, unspecified, not applicable or unspecified: Secondary | ICD-10-CM

## 2020-01-06 DIAGNOSIS — O133 Gestational [pregnancy-induced] hypertension without significant proteinuria, third trimester: Secondary | ICD-10-CM

## 2020-01-06 DIAGNOSIS — Z3A33 33 weeks gestation of pregnancy: Secondary | ICD-10-CM

## 2020-01-07 ENCOUNTER — Ambulatory Visit (INDEPENDENT_AMBULATORY_CARE_PROVIDER_SITE_OTHER): Payer: Medicaid Other | Admitting: Family Medicine

## 2020-01-07 VITALS — BP 132/86 | HR 120 | Wt 224.4 lb

## 2020-01-07 DIAGNOSIS — Z3A33 33 weeks gestation of pregnancy: Secondary | ICD-10-CM

## 2020-01-07 DIAGNOSIS — O283 Abnormal ultrasonic finding on antenatal screening of mother: Secondary | ICD-10-CM

## 2020-01-07 DIAGNOSIS — O26873 Cervical shortening, third trimester: Secondary | ICD-10-CM

## 2020-01-07 DIAGNOSIS — O26879 Cervical shortening, unspecified trimester: Secondary | ICD-10-CM

## 2020-01-07 DIAGNOSIS — Z3400 Encounter for supervision of normal first pregnancy, unspecified trimester: Secondary | ICD-10-CM

## 2020-01-07 DIAGNOSIS — O1493 Unspecified pre-eclampsia, third trimester: Secondary | ICD-10-CM

## 2020-01-07 NOTE — Progress Notes (Signed)
Subjective:  Claire Nash is a 24 y.o. G1P0 at [redacted]w[redacted]d being seen today for ongoing prenatal care.  She is currently monitored for the following issues for this high-risk pregnancy and has MIGRAINE WITH AURA; Back pain; Supervision of normal first pregnancy, antepartum; History of marijuana use; Short cervix affecting pregnancy; Genetic carrier; Echogenic intracardiac focus of fetus on prenatal ultrasound; and Preeclampsia, third trimester on their problem list.  Patient reports no complaints. Her headaches have improved with changes in her diet- she is no longer eating fast foods or fried things. She is focusing more on fruits and vegetables.  Contractions: Irritability. Vag. Bleeding: None.  Movement: Present. Denies leaking of fluid.   The following portions of the patient's history were reviewed and updated as appropriate: allergies, current medications, past family history, past medical history, past social history, past surgical history and problem list. Problem list updated.  Objective:   Vitals:   01/07/20 1554  BP: 132/86  Pulse: (!) 120  Weight: 224 lb 6.4 oz (101.8 kg)    Fetal Status: Fetal Heart Rate (bpm): 152 Fundal Height: 34 cm Movement: Present     General:  Alert, oriented and cooperative. Patient is in no acute distress.  Skin: Skin is warm and dry. No rash noted.   Cardiovascular: Normal heart rate noted  Respiratory: Normal respiratory effort, no problems with respiration noted  Abdomen: Soft, gravid, appropriate for gestational age. Pain/Pressure: Present     Pelvic: Vag. Bleeding: None     Cervical exam deferred        Extremities: Normal range of motion.  Edema: Trace  Mental Status: Normal mood and affect. Normal behavior. Normal judgment and thought content.   Urinalysis:      Assessment and Plan:  Pregnancy: G1P0 at [redacted]w[redacted]d  1. Supervision of normal first pregnancy, antepartum - Continue routine prenatal care  2. Short cervix affecting pregnancy -  Stable - continue vaginal progesterone til 36-37 weeks  3. Echogenic intracardiac focus of fetus on prenatal ultrasound - Stable - Follow up sono 01/27/20  4. Preeclampsia, third trimester - Headaches have improved with diet - BPs stable in the 130s-140/70s-80s, no severe range pressures - Diagnosed on 11/23 with a P:Cr of 0.30  Preterm labor symptoms and general obstetric precautions including but not limited to vaginal bleeding, contractions, leaking of fluid and fetal movement were reviewed in detail with the patient. Please refer to After Visit Summary for other counseling recommendations.  Return in about 2 weeks (around 01/21/2020) for [facility].   Debbi Strandberg L, DO

## 2020-01-07 NOTE — Patient Instructions (Signed)

## 2020-01-07 NOTE — Progress Notes (Signed)
Pt presents for ROB. Pt has no concerns today. 

## 2020-01-21 ENCOUNTER — Ambulatory Visit (INDEPENDENT_AMBULATORY_CARE_PROVIDER_SITE_OTHER): Payer: Medicaid Other | Admitting: Obstetrics and Gynecology

## 2020-01-21 ENCOUNTER — Other Ambulatory Visit: Payer: Self-pay

## 2020-01-21 ENCOUNTER — Encounter: Payer: Self-pay | Admitting: Obstetrics and Gynecology

## 2020-01-21 VITALS — BP 132/84 | HR 112 | Temp 97.7°F | Wt 231.0 lb

## 2020-01-21 DIAGNOSIS — O26879 Cervical shortening, unspecified trimester: Secondary | ICD-10-CM

## 2020-01-21 DIAGNOSIS — O283 Abnormal ultrasonic finding on antenatal screening of mother: Secondary | ICD-10-CM

## 2020-01-21 DIAGNOSIS — O26873 Cervical shortening, third trimester: Secondary | ICD-10-CM

## 2020-01-21 DIAGNOSIS — O1493 Unspecified pre-eclampsia, third trimester: Secondary | ICD-10-CM

## 2020-01-21 DIAGNOSIS — Z3400 Encounter for supervision of normal first pregnancy, unspecified trimester: Secondary | ICD-10-CM

## 2020-01-21 DIAGNOSIS — Z3A35 35 weeks gestation of pregnancy: Secondary | ICD-10-CM

## 2020-01-21 NOTE — Progress Notes (Signed)
   PRENATAL VISIT NOTE  Subjective:  Claire Nash is a 24 y.o. G1P0 at [redacted]w[redacted]d being seen today for ongoing prenatal care.  She is currently monitored for the following issues for this high-risk pregnancy and has MIGRAINE WITH AURA; Back pain; Supervision of normal first pregnancy, antepartum; History of marijuana use; Short cervix affecting pregnancy; Genetic carrier; Echogenic intracardiac focus of fetus on prenatal ultrasound; and Preeclampsia, third trimester on their problem list.  Patient reports pelvic pain and pressure.  Contractions: Irritability. Vag. Bleeding: None.  Movement: Present. Denies leaking of fluid.   The following portions of the patient's history were reviewed and updated as appropriate: allergies, current medications, past family history, past medical history, past social history, past surgical history and problem list.   Objective:   Vitals:   01/21/20 1123  BP: 132/84  Pulse: (!) 112  Temp: 97.7 F (36.5 C)  Weight: 231 lb (104.8 kg)    Fetal Status: Fetal Heart Rate (bpm): 156   Movement: Present     General:  Alert, oriented and cooperative. Patient is in no acute distress.  Skin: Skin is warm and dry. No rash noted.   Cardiovascular: Normal heart rate noted  Respiratory: Normal respiratory effort, no problems with respiration noted  Abdomen: Soft, gravid, appropriate for gestational age.  Pain/Pressure: Present     Pelvic: Cervical exam deferred        Extremities: Normal range of motion.  Edema: Trace  Mental Status: Normal mood and affect. Normal behavior. Normal judgment and thought content.   Assessment and Plan:  Pregnancy: G1P0 at [redacted]w[redacted]d  1. Supervision of normal first pregnancy, antepartum  2. Short cervix affecting pregnancy Cont vag progesterone  3. Echogenic intracardiac focus of fetus on prenatal ultrasound  4. Preeclampsia, third trimester PT diagnosed with PEC in November, BP at home per patient 140s but all in office have been  120-130s. UPCr 0.30 in November - reviewed risks/benefits of IOL at 37 weeks with no elevated BP and UPCr - will repeat labs today, see how BP goes over next week, may delay IOL til 38-39 weeks if remains stable, pt verbalizes understanding and is in agreement with plan - Protein / creatinine ratio, urine - CBC - Comprehensive metabolic panel  Preterm labor symptoms and general obstetric precautions including but not limited to vaginal bleeding, contractions, leaking of fluid and fetal movement were reviewed in detail with the patient. Please refer to After Visit Summary for other counseling recommendations.   Return in about 1 week (around 01/28/2020) for 36 week swabs, in person, virtual.  Future Appointments  Date Time Provider Department Center  01/27/2020 11:00 AM [facility] NURSE [facility]  01/27/2020 11:00 AM [facility] [place] 3 [facility]    Conan Bowens, MD

## 2020-01-21 NOTE — Patient Instructions (Signed)
Use the following websites (and others) to help learn more about your contraception options and find the method that is right for you!  - The Centers for Disease Control (CDC) website: https://www.cdc.gov/reproductivehealth/contraception/index.htm  - [facility] website: https://www.[facility]renthood.org/learn/birth-control  - [employer]eds[employer]org: https://www.bedsider.org/methods  

## 2020-01-22 LAB — PROTEIN / CREATININE RATIO, URINE
Creatinine, Urine: 75.6 mg/dL
Protein, Ur: 35.1 mg/dL
Protein/Creat Ratio: 464 mg/g creat — ABNORMAL HIGH (ref 0–200)

## 2020-01-22 LAB — CBC
Hematocrit: 34.9 % (ref 34.0–46.6)
Hemoglobin: 11.3 g/dL (ref 11.1–15.9)
MCH: 28.2 pg (ref 26.6–33.0)
MCHC: 32.4 g/dL (ref 31.5–35.7)
MCV: 87 fL (ref 79–97)
Platelets: 206 10*3/uL (ref 150–450)
RBC: 4.01 x10E6/uL (ref 3.77–5.28)
RDW: 13.4 % (ref 11.7–15.4)
WBC: 13.6 10*3/uL — ABNORMAL HIGH (ref 3.4–10.8)

## 2020-01-22 LAB — COMPREHENSIVE METABOLIC PANEL
ALT: 20 IU/L (ref 0–32)
AST: 21 IU/L (ref 0–40)
Albumin/Globulin Ratio: 1.2 (ref 1.2–2.2)
Albumin: 3.4 g/dL — ABNORMAL LOW (ref 3.9–5.0)
Alkaline Phosphatase: 159 IU/L — ABNORMAL HIGH (ref 39–117)
BUN/Creatinine Ratio: 7 — ABNORMAL LOW (ref 9–23)
BUN: 5 mg/dL — ABNORMAL LOW (ref 6–20)
Bilirubin Total: <0.2 mg/dL (ref 0.0–1.2)
CO2: 20 mmol/L (ref 20–29)
Calcium: 8.9 mg/dL (ref 8.7–10.2)
Chloride: 106 mmol/L (ref 96–106)
Creatinine, Ser: 0.7 mg/dL (ref 0.57–1.00)
GFR calc Af Amer: 141 mL/min/{1.73_m2} (ref >59)
GFR calc non Af Amer: 123 mL/min/{1.73_m2} (ref >59)
Globulin, Total: 2.9 g/dL (ref 1.5–4.5)
Glucose: 83 mg/dL (ref 65–99)
Potassium: 4.1 mmol/L (ref 3.5–5.2)
Sodium: 138 mmol/L (ref 134–144)
Total Protein: 6.3 g/dL (ref 6.0–8.5)

## 2020-01-27 ENCOUNTER — Ambulatory Visit (HOSPITAL_COMMUNITY)
Admission: RE | Admit: 2020-01-27 | Discharge: 2020-01-27 | Disposition: A | Discharge status: Home / Self Care | Payer: Medicaid Other | Source: Ambulatory Visit | Attending: Obstetrics and Gynecology | Admitting: Obstetrics and Gynecology

## 2020-01-27 ENCOUNTER — Ambulatory Visit (HOSPITAL_COMMUNITY): Payer: Medicaid Other | Admitting: *Deleted

## 2020-01-27 ENCOUNTER — Other Ambulatory Visit: Payer: Self-pay

## 2020-01-27 ENCOUNTER — Encounter (HOSPITAL_COMMUNITY): Payer: Self-pay

## 2020-01-27 DIAGNOSIS — F1291 Cannabis use, unspecified, in remission: Secondary | ICD-10-CM

## 2020-01-27 DIAGNOSIS — O359XX0 Maternal care for (suspected) fetal abnormality and damage, unspecified, not applicable or unspecified: Secondary | ICD-10-CM

## 2020-01-27 DIAGNOSIS — O26879 Cervical shortening, unspecified trimester: Secondary | ICD-10-CM | POA: Insufficient documentation

## 2020-01-27 DIAGNOSIS — O1493 Unspecified pre-eclampsia, third trimester: Secondary | ICD-10-CM | POA: Insufficient documentation

## 2020-01-27 DIAGNOSIS — Z3A36 36 weeks gestation of pregnancy: Secondary | ICD-10-CM | POA: Diagnosis not present

## 2020-01-27 DIAGNOSIS — Z362 Encounter for other antenatal screening follow-up: Secondary | ICD-10-CM

## 2020-01-27 DIAGNOSIS — O283 Abnormal ultrasonic finding on antenatal screening of mother: Secondary | ICD-10-CM | POA: Diagnosis present

## 2020-01-27 DIAGNOSIS — Z87898 Personal history of other specified conditions: Secondary | ICD-10-CM

## 2020-01-27 DIAGNOSIS — Z148 Genetic carrier of other disease: Secondary | ICD-10-CM | POA: Diagnosis present

## 2020-01-27 DIAGNOSIS — O133 Gestational [pregnancy-induced] hypertension without significant proteinuria, third trimester: Secondary | ICD-10-CM | POA: Diagnosis not present

## 2020-01-28 ENCOUNTER — Ambulatory Visit (INDEPENDENT_AMBULATORY_CARE_PROVIDER_SITE_OTHER): Payer: Medicaid Other | Admitting: Obstetrics & Gynecology

## 2020-01-28 ENCOUNTER — Other Ambulatory Visit (HOSPITAL_COMMUNITY)
Admission: RE | Admit: 2020-01-28 | Discharge: 2020-01-28 | Disposition: A | Discharge status: Home / Self Care | Payer: Medicaid Other | Source: Ambulatory Visit | Attending: Obstetrics & Gynecology | Admitting: Obstetrics & Gynecology

## 2020-01-28 VITALS — BP 130/82 | HR 103 | Wt 237.0 lb

## 2020-01-28 DIAGNOSIS — Z3400 Encounter for supervision of normal first pregnancy, unspecified trimester: Secondary | ICD-10-CM | POA: Insufficient documentation

## 2020-01-28 DIAGNOSIS — O26879 Cervical shortening, unspecified trimester: Secondary | ICD-10-CM

## 2020-01-28 DIAGNOSIS — O26873 Cervical shortening, third trimester: Secondary | ICD-10-CM

## 2020-01-28 DIAGNOSIS — O1493 Unspecified pre-eclampsia, third trimester: Secondary | ICD-10-CM

## 2020-01-28 DIAGNOSIS — Z3A36 36 weeks gestation of pregnancy: Secondary | ICD-10-CM

## 2020-01-28 NOTE — Progress Notes (Signed)
   PRENATAL VISIT NOTE  Subjective:  Claire Nash is a 24 y.o. G1P0 at [redacted]w[redacted]d being seen today for ongoing prenatal care.  She is currently monitored for the following issues for this high-risk pregnancy and has MIGRAINE WITH AURA; Back pain; Supervision of normal first pregnancy, antepartum; History of marijuana use; Short cervix affecting pregnancy; Genetic carrier; Echogenic intracardiac focus of fetus on prenatal ultrasound; and Preeclampsia, third trimester on their problem list.  Patient reports no complaints.  Contractions: Not present. Vag. Bleeding: None.  Movement: Present. Denies leaking of fluid.   The following portions of the patient's history were reviewed and updated as appropriate: allergies, current medications, past family history, past medical history, past social history, past surgical history and problem list.   Objective:   Vitals:   01/28/20 1505  BP: 130/82  Pulse: (!) 103  Weight: 237 lb (107.5 kg)    Fetal Status: Fetal Heart Rate (bpm): 151   Movement: Present  Presentation: Vertex  General:  Alert, oriented and cooperative. Patient is in no acute distress.  Skin: Skin is warm and dry. No rash noted.   Cardiovascular: Normal heart rate noted  Respiratory: Normal respiratory effort, no problems with respiration noted  Abdomen: Soft, gravid, appropriate for gestational age.  Pain/Pressure: Present     Pelvic: Cervical exam performed Dilation: Fingertip Effacement (%): 50 Station: Ballotable  Extremities: Normal range of motion.  Edema: Trace  Mental Status: Normal mood and affect. Normal behavior. Normal judgment and thought content.   Assessment and Plan:  Pregnancy: G1P0 at [redacted]w[redacted]d 1. Short cervix affecting pregnancy Has stopped Prometrium, close to full-term  2. Supervision of normal first pregnancy, antepartum Routine testing - Cervicovaginal ancillary only - Culture, beta strep (group b only)  3. Preeclampsia, third trimester BP stable none  140/90, f/u next week to determine when to induce, no later than 39 weeks Preterm labor symptoms and general obstetric precautions including but not limited to vaginal bleeding, contractions, leaking of fluid and fetal movement were reviewed in detail with the patient. Please refer to After Visit Summary for other counseling recommendations.      Return in about 1 week (around 02/04/2020).  Future Appointments  Date Time Provider Department Center  02/03/2020 10:15 AM Adam Phenix, MD [facility] None    Scheryl Darter, MD

## 2020-01-28 NOTE — Patient Instructions (Signed)

## 2020-01-29 LAB — CERVICOVAGINAL ANCILLARY ONLY
Bacterial Vaginitis (gardnerella): NEGATIVE
Candida Glabrata: NEGATIVE
Candida Vaginitis: NEGATIVE
Chlamydia: NEGATIVE
Comment: NEGATIVE
Comment: NEGATIVE
Comment: NEGATIVE
Comment: NEGATIVE
Comment: NEGATIVE
Comment: NORMAL
Neisseria Gonorrhea: NEGATIVE
Trichomonas: NEGATIVE

## 2020-02-01 LAB — CULTURE, BETA STREP (GROUP B ONLY): Strep Gp B Culture: NEGATIVE

## 2020-02-02 ENCOUNTER — Telehealth: Payer: Self-pay | Admitting: Obstetrics and Gynecology

## 2020-02-02 ENCOUNTER — Encounter (HOSPITAL_COMMUNITY): Payer: Self-pay | Admitting: Family Medicine

## 2020-02-02 ENCOUNTER — Other Ambulatory Visit: Payer: Self-pay

## 2020-02-02 ENCOUNTER — Inpatient Hospital Stay (HOSPITAL_COMMUNITY)
Admission: AD | Admit: 2020-02-02 | Discharge: 2020-02-05 | DRG: 806 | Disposition: A | Discharge status: Home / Self Care | Payer: Medicaid Other | Attending: Obstetrics & Gynecology | Admitting: Obstetrics & Gynecology

## 2020-02-02 DIAGNOSIS — Z148 Genetic carrier of other disease: Secondary | ICD-10-CM

## 2020-02-02 DIAGNOSIS — O26879 Cervical shortening, unspecified trimester: Secondary | ICD-10-CM | POA: Diagnosis present

## 2020-02-02 DIAGNOSIS — F1291 Cannabis use, unspecified, in remission: Secondary | ICD-10-CM

## 2020-02-02 DIAGNOSIS — Z20822 Contact with and (suspected) exposure to covid-19: Secondary | ICD-10-CM | POA: Diagnosis present

## 2020-02-02 DIAGNOSIS — Z87898 Personal history of other specified conditions: Secondary | ICD-10-CM

## 2020-02-02 DIAGNOSIS — O26873 Cervical shortening, third trimester: Secondary | ICD-10-CM | POA: Diagnosis present

## 2020-02-02 DIAGNOSIS — O1493 Unspecified pre-eclampsia, third trimester: Secondary | ICD-10-CM | POA: Diagnosis present

## 2020-02-02 DIAGNOSIS — Z30017 Encounter for initial prescription of implantable subdermal contraceptive: Secondary | ICD-10-CM

## 2020-02-02 DIAGNOSIS — Z3400 Encounter for supervision of normal first pregnancy, unspecified trimester: Secondary | ICD-10-CM

## 2020-02-02 DIAGNOSIS — Z975 Presence of (intrauterine) contraceptive device: Secondary | ICD-10-CM

## 2020-02-02 DIAGNOSIS — O1404 Mild to moderate pre-eclampsia, complicating childbirth: Secondary | ICD-10-CM | POA: Diagnosis present

## 2020-02-02 DIAGNOSIS — Z3A37 37 weeks gestation of pregnancy: Secondary | ICD-10-CM

## 2020-02-02 DIAGNOSIS — O283 Abnormal ultrasonic finding on antenatal screening of mother: Secondary | ICD-10-CM | POA: Diagnosis present

## 2020-02-02 DIAGNOSIS — G43109 Migraine with aura, not intractable, without status migrainosus: Secondary | ICD-10-CM | POA: Diagnosis present

## 2020-02-02 LAB — CBC
HCT: 34.1 % — ABNORMAL LOW (ref 36.0–46.0)
Hemoglobin: 11.1 g/dL — ABNORMAL LOW (ref 12.0–15.0)
MCH: 27.1 pg (ref 26.0–34.0)
MCHC: 32.6 g/dL (ref 30.0–36.0)
MCV: 83.2 fL (ref 80.0–100.0)
Platelets: 209 10*3/uL (ref 150–400)
RBC: 4.1 MIL/uL (ref 3.87–5.11)
RDW: 14.8 % (ref 11.5–15.5)
WBC: 12.7 10*3/uL — ABNORMAL HIGH (ref 4.0–10.5)
nRBC: 0 % (ref 0.0–0.2)

## 2020-02-02 LAB — PROTEIN / CREATININE RATIO, URINE
Creatinine, Urine: 70.76 mg/dL
Protein Creatinine Ratio: 0.68 mg/mg{Cre} — ABNORMAL HIGH (ref 0.00–0.15)
Total Protein, Urine: 48 mg/dL

## 2020-02-02 LAB — COMPREHENSIVE METABOLIC PANEL
ALT: 29 U/L (ref 0–44)
AST: 29 U/L (ref 15–41)
Albumin: 2.6 g/dL — ABNORMAL LOW (ref 3.5–5.0)
Alkaline Phosphatase: 152 U/L — ABNORMAL HIGH (ref 38–126)
Anion gap: 11 (ref 5–15)
BUN: 5 mg/dL — ABNORMAL LOW (ref 6–20)
CO2: 17 mmol/L — ABNORMAL LOW (ref 22–32)
Calcium: 8.6 mg/dL — ABNORMAL LOW (ref 8.9–10.3)
Chloride: 107 mmol/L (ref 98–111)
Creatinine, Ser: 0.74 mg/dL (ref 0.44–1.00)
GFR calc Af Amer: >60 mL/min (ref >60)
GFR calc non Af Amer: >60 mL/min (ref >60)
Glucose, Bld: 104 mg/dL — ABNORMAL HIGH (ref 70–99)
Potassium: 3.9 mmol/L (ref 3.5–5.1)
Sodium: 135 mmol/L (ref 135–145)
Total Bilirubin: 0.5 mg/dL (ref 0.3–1.2)
Total Protein: 6.2 g/dL — ABNORMAL LOW (ref 6.5–8.1)

## 2020-02-02 LAB — TYPE AND SCREEN
ABO/RH(D): O POS
Antibody Screen: NEGATIVE

## 2020-02-02 MED ORDER — ONDANSETRON HCL 4 MG/2ML IJ SOLN: 4.0000 mg | Freq: Four times a day (QID) | INTRAMUSCULAR | Status: DC | PRN

## 2020-02-02 MED ORDER — MISOPROSTOL 50MCG HALF TABLET
50.0000 ug | ORAL_TABLET | ORAL | Status: DC
  Administered 2020-02-02: 22:00:00 50 ug via ORAL
  Filled 2020-02-02: qty 1

## 2020-02-02 MED ORDER — SOD CITRATE-CITRIC ACID 500-334 MG/5ML PO SOLN: 30.0000 mL | ORAL | Status: DC | PRN

## 2020-02-02 MED ORDER — OXYTOCIN BOLUS FROM INFUSION
500.0000 mL | Freq: Once | INTRAVENOUS | Status: AC
  Administered 2020-02-03: 500 mL via INTRAVENOUS

## 2020-02-02 MED ORDER — OXYTOCIN 40 UNITS IN NORMAL SALINE INFUSION - SIMPLE MED: 2.5000 [IU]/h | INTRAVENOUS | Status: DC

## 2020-02-02 MED ORDER — OXYCODONE-ACETAMINOPHEN 5-325 MG PO TABS: 1.0000 | ORAL_TABLET | ORAL | Status: DC | PRN

## 2020-02-02 MED ORDER — LACTATED RINGERS IV SOLN
500.0000 mL | INTRAVENOUS | Status: DC | PRN
  Administered 2020-02-03: 09:00:00 500 mL via INTRAVENOUS

## 2020-02-02 MED ORDER — LACTATED RINGERS IV SOLN: INTRAVENOUS | Status: DC

## 2020-02-02 MED ORDER — OXYCODONE-ACETAMINOPHEN 5-325 MG PO TABS: 2.0000 | ORAL_TABLET | ORAL | Status: DC | PRN

## 2020-02-02 MED ORDER — ZOLPIDEM TARTRATE 5 MG PO TABS: 5.0000 mg | ORAL_TABLET | Freq: Once | ORAL | Status: DC

## 2020-02-02 MED ORDER — ACETAMINOPHEN 325 MG PO TABS
650.0000 mg | ORAL_TABLET | ORAL | Status: DC | PRN
  Administered 2020-02-03 (×2): 650 mg via ORAL
  Filled 2020-02-02 (×2): qty 2

## 2020-02-02 MED ORDER — BUTALBITAL-APAP-CAFFEINE 50-325-40 MG PO TABS
1.0000 | ORAL_TABLET | ORAL | Status: DC | PRN
  Administered 2020-02-02: 20:00:00 1 via ORAL
  Filled 2020-02-02: qty 1

## 2020-02-02 MED ORDER — LIDOCAINE HCL (PF) 1 % IJ SOLN
30.0000 mL | INTRAMUSCULAR | Status: AC | PRN
  Administered 2020-02-03 (×2): 4 mL via SUBCUTANEOUS

## 2020-02-02 NOTE — MAU Provider Note (Signed)
History   [id number]   Chief Complaint  Patient presents with  . Foot Swelling    HPI Claire Nash is a 24 y.o. female  G1P0 @37 .3 wks sent from office for elevated BP.  Reports temporal HA today. Has not taken anything for it. Denies visual disturbances, RUQ pain, CP, and SOB. She reports good fetal movement. No VB, LOF, or ctx. All other systems negative.    Patient's last menstrual period was 05/16/2019 (exact date).  OB History  Gravida Para Term Preterm AB Living  1            SAB TAB Ectopic Multiple Live Births               # Outcome Date GA Lbr Len/2nd Weight Sex Delivery Anes PTL Lv  1 Current             Past Medical History:  Diagnosis Date  . Chronic pelvic pain in female 04/23/2013  . Migraines   . Short cervix     Family History  Problem Relation Age of Onset  . Hypertension Mother   . Fibroids Mother   . Hypertension Father   . Asthma Father   . Hypertension Maternal Grandmother   . Drug abuse Maternal Grandmother   . Drug abuse Maternal Grandfather   . Breast cancer Maternal Aunt     Social History   Socioeconomic History  . Marital status: Single    Spouse name: Not on file  . Number of children: Not on file  . Years of education: Not on file  . Highest education level: Not on file  Occupational History  . Not on file  Tobacco Use  . Smoking status: Never Smoker  . Smokeless tobacco: Never Used  Substance and Sexual Activity  . Alcohol use: Not Currently    Comment: occ  . Drug use: Not Currently    Types: Marijuana    Comment: Last use June 2020  . Sexual activity: Yes    Birth control/protection: None  Other Topics Concern  . Not on file  Social History Narrative  . Not on file   Social Determinants of Health   Financial Resource Strain:   . Difficulty of Paying Living Expenses: Not on file  Food Insecurity:   . Worried About Charity fundraiser in the Last Year: Not on file  . Ran Out of Food in the Last Year: Not on  file  Transportation Needs:   . Lack of Transportation (Medical): Not on file  . Lack of Transportation (Non-Medical): Not on file  Physical Activity:   . Days of Exercise per Week: Not on file  . Minutes of Exercise per Session: Not on file  Stress:   . Feeling of Stress : Not on file  Social Connections:   . Frequency of Communication with Friends and Family: Not on file  . Frequency of Social Gatherings with Friends and Family: Not on file  . Attends Religious Services: Not on file  . Active Member of Clubs or Organizations: Not on file  . Attends Archivist Meetings: Not on file  . Marital Status: Not on file    No Known Allergies  No current facility-administered medications on file prior to encounter.   Current Outpatient Medications on File Prior to Encounter  Medication Sig Dispense Refill  . Blood Pressure Monitor KIT 1 kit by Does not apply route once a week. 1 kit 0  . butalbital-acetaminophen-caffeine (FIORICET) 50-325-40 MG  tablet Take 1-2 tablets by mouth every 6 (six) hours as needed for headache. 20 tablet 0  . cyclobenzaprine (FLEXERIL) 10 MG tablet Take 1 tablet (10 mg total) by mouth 3 (three) times daily as needed (Migarines). 30 tablet 0  . Prenatal Vit-Fe Fumarate-FA (PRENATAL MULTIVITAMIN) TABS tablet Take 1 tablet by mouth daily at 12 noon.    . progesterone (PROMETRIUM) 200 MG capsule Place 1 capsule (200 mg total) vaginally at bedtime. 90 capsule 1     Review of Systems  Eyes: Negative for visual disturbance.  Respiratory: Negative for shortness of breath.   Cardiovascular: Negative for chest pain.  Genitourinary: Negative for vaginal bleeding and vaginal discharge.  Neurological: Positive for headaches.     Physical Exam   Patient Vitals for the past 24 hrs:  BP Temp Temp src Pulse Resp SpO2  02/02/20 2015 140/85 -- -- (!) 113 -- --  02/02/20 2000 131/78 -- -- (!) 102 -- --  02/02/20 1945 (!) 141/93 -- -- (!) 113 -- --  02/02/20  1930 (!) 142/85 -- -- (!) 109 -- --  02/02/20 1915 132/85 -- -- (!) 115 -- --  02/02/20 1914 110/79 -- -- (!) 113 -- 100 %  02/02/20 1911 -- -- -- -- 16 --  02/02/20 1857 (!) 147/83 97.9 F (36.6 C) Oral (!) 119 (!) 24 100 %    Physical Exam  Nursing note and vitals reviewed. Constitutional: She is oriented to person, place, and time. She appears well-developed and well-nourished. No distress.  HENT:  Head: Normocephalic and atraumatic.  Cardiovascular: Normal rate.  Respiratory: Effort normal. No respiratory distress.  Musculoskeletal:        General: Normal range of motion.     Cervical back: Normal range of motion.  Neurological: She is alert and oriented to person, place, and time.  Skin: Skin is warm and dry.  Psychiatric: She has a normal mood and affect.  EFM: 140 bpm, mod variability, + accels, no decels Toco: rare  Results for orders placed or performed during the hospital encounter of 02/02/20 (from the past 24 hour(s))  CBC     Status: Abnormal   Collection Time: 02/02/20  6:57 PM  Result Value Ref Range   WBC 12.7 (H) 4.0 - 10.5 K/uL   RBC 4.10 3.87 - 5.11 MIL/uL   Hemoglobin 11.1 (L) 12.0 - 15.0 g/dL   HCT 34.1 (L) 36.0 - 46.0 %   MCV 83.2 80.0 - 100.0 fL   MCH 27.1 26.0 - 34.0 pg   MCHC 32.6 30.0 - 36.0 g/dL   RDW 14.8 11.5 - 15.5 %   Platelets 209 150 - 400 K/uL   nRBC 0.0 0.0 - 0.2 %  Comprehensive metabolic panel     Status: Abnormal   Collection Time: 02/02/20  6:57 PM  Result Value Ref Range   Sodium 135 135 - 145 mmol/L   Potassium 3.9 3.5 - 5.1 mmol/L   Chloride 107 98 - 111 mmol/L   CO2 17 (L) 22 - 32 mmol/L   Glucose, Bld 104 (H) 70 - 99 mg/dL   BUN 5 (L) 6 - 20 mg/dL   Creatinine, Ser 0.74 0.44 - 1.00 mg/dL   Calcium 8.6 (L) 8.9 - 10.3 mg/dL   Total Protein 6.2 (L) 6.5 - 8.1 g/dL   Albumin 2.6 (L) 3.5 - 5.0 g/dL   AST 29 15 - 41 U/L   ALT 29 0 - 44 U/L   Alkaline Phosphatase 152 (H) 38 -  126 U/L   Total Bilirubin 0.5 0.3 - 1.2 mg/dL   GFR  calc non Af Amer >60 >60 mL/min   GFR calc Af Amer >60 >60 mL/min   Anion gap 11 5 - 15  Type and screen [place]     Status: None (Preliminary result)   Collection Time: 02/02/20  7:01 PM  Result Value Ref Range   ABO/RH(D) PENDING    Antibody Screen PENDING    Sample Expiration      02/05/2020,2359 Performed at [facility], [place] [address] [place], [place] [address]   Protein / creatinine ratio, urine     Status: Abnormal   Collection Time: 02/02/20  7:16 PM  Result Value Ref Range   Creatinine, Urine 70.76 mg/dL   Total Protein, Urine 48 mg/dL   Protein Creatinine Ratio 0.68 (H) 0.00 - 0.15 mg/mg[Cre]   MAU Course  Procedures  MDM Labs ordered and reviewed. Plan for admit and IOL. Dr. Fayette Pho and Dr. Dione Plover notified.   Assessment and Plan   1. Pre-eclampsia in third trimester   2. [redacted] weeks gestation  Admit to LD Mngt per labor team   Julianne Handler, [place] 02/02/2020 8:47 PM

## 2020-02-02 NOTE — H&P (Addendum)
LABOR AND DELIVERY ADMISSION HISTORY AND PHYSICAL NOTE  Claire Nash is a 24 y.o. female G1P0 with IUP at 52w3dby LMP that presents at the recommendation of Dr. DRosana Hoesafter she complained of right hand numbness and swollen feet.  Upon arrival to MAU, it was noted that her blood pressure has been elevated in the 1060Oto 1459systolic and 897Fto 941Sdiastolic.  Her protein to creatinine ratio was elevated at 0.68.  Her LFTs, creatinine and platelets have all been within normal limits. She did complain of headache, however this was relieved by Fioricet.  Her antenatal history for this pregnancy does include elevated blood pressures back in November as well as an elevated protein creatinine ratio that normalized without antihypertensive treatment.  She is being admitted for induction of labor secondary to preeclampsia without severe features.  She reports positive fetal movement. She denies leakage of fluid, vaginal bleeding, or contractions.   She plans on breast feeding. Her contraception plan is: Possibly Nexplanon but still undecided.  [facility] , CWD, normal anatomy, cephalic presentation, fundal left placenta, 68th%ile, EFW [id number] Pregnancy complications:  - Preeclampsia without severe features  Past Medical History: Past Medical History:  Diagnosis Date  . Chronic pelvic pain in female 04/23/2013  . Migraines   . Short cervix     Past Surgical History: Past Surgical History:  Procedure Laterality Date  . URETHRA SURGERY      Obstetrical History: OB History    Gravida  1   Para      Term      Preterm      AB      Living        SAB      TAB      Ectopic      Multiple      Live Births              Social History: Social History   Socioeconomic History  . Marital status: Single    Spouse name: Not on file  . Number of children: Not on file  . Years of education: Not on file  . Highest education level:  Not on file  Occupational History  . Not on file  Tobacco Use  . Smoking status: Never Smoker  . Smokeless tobacco: Never Used  Substance and Sexual Activity  . Alcohol use: Not Currently    Comment: occ  . Drug use: Not Currently    Types: Marijuana    Comment: Last use June 2020  . Sexual activity: Yes    Birth control/protection: None  Other Topics Concern  . Not on file  Social History Narrative  . Not on file   Social Determinants of Health   Financial Resource Strain:   . Difficulty of Paying Living Expenses: Not on file  Food Insecurity:   . Worried About RCharity fundraiserin the Last Year: Not on file  . Ran Out of Food in the Last Year: Not on file  Transportation Needs:   . Lack of Transportation (Medical): Not on file  . Lack of Transportation (Non-Medical): Not on file  Physical Activity:   . Days of Exercise per Week: Not on file  . Minutes of Exercise per Session: Not on file  Stress:   . Feeling of Stress : Not on file  Social Connections:   . Frequency of Communication with Friends and Family: Not on file  . Frequency of  Social Gatherings with Friends and Family: Not on file  . Attends Religious Services: Not on file  . Active Member of Clubs or Organizations: Not on file  . Attends Archivist Meetings: Not on file  . Marital Status: Not on file    Family History: Family History  Problem Relation Age of Onset  . Hypertension Mother   . Fibroids Mother   . Hypertension Father   . Asthma Father   . Hypertension Maternal Grandmother   . Drug abuse Maternal Grandmother   . Drug abuse Maternal Grandfather   . Breast cancer Maternal Aunt     Allergies: No Known Allergies  Medications Prior to Admission  Medication Sig Dispense Refill Last Dose  . Blood Pressure Monitor KIT 1 kit by Does not apply route once a week. 1 kit 0 02/01/2020 at Unknown time  . butalbital-acetaminophen-caffeine (FIORICET) 50-325-40 MG tablet Take 1-2 tablets by  mouth every 6 (six) hours as needed for headache. 20 tablet 0 Past Month at Unknown time  . cyclobenzaprine (FLEXERIL) 10 MG tablet Take 1 tablet (10 mg total) by mouth 3 (three) times daily as needed (Migarines). 30 tablet 0 02/01/2020 at Unknown time  . Prenatal Vit-Fe Fumarate-FA (PRENATAL MULTIVITAMIN) TABS tablet Take 1 tablet by mouth daily at 12 noon.   02/02/2020 at Unknown time  . progesterone (PROMETRIUM) 200 MG capsule Place 1 capsule (200 mg total) vaginally at bedtime. 90 capsule 1      Review of Systems  All systems reviewed and negative except as stated in HPI  Physical Exam Blood pressure (!) 133/91, pulse (!) 110, temperature 98.2 F (36.8 C), temperature source Oral, resp. rate 16, last menstrual period 05/16/2019, SpO2 100 %. General appearance: alert, oriented, NAD Lungs: normal respiratory effort Heart: regular rate Abdomen: soft, non-tender; gravid Extremities: No calf swelling or tenderness. Mild swelling of the right hand and both feet, non-pitting. Presentation: cephalic by cervical check  Fetal monitoring: Baseline: 150 bpm, Variability: Good {> 6 bpm), Accelerations: 10x10 and Decelerations: Absent Uterine activity: Occasional  Dilation: 1 Effacement (%): Thick Station: -3  Prenatal labs: ABO, Rh: --/--/O POS (02/09 1901) Antibody: NEG (02/09 1901) Rubella: 11.70 (08/21 0940) RPR: Non Reactive (12/09 0939)  HBsAg: Negative (08/21 0940)  HIV: Non Reactive (12/09 0939)  GC/Chlamydia: Negative GBS: Negative/-- (02/04 0412)  2-hr GTT: Nregative Genetic screening:  WNL Anatomy US: Normal  Prenatal Transfer Tool  Maternal Diabetes: No Genetic Screening: Normal Maternal Ultrasounds/Referrals: Normal Fetal Ultrasounds or other Referrals:  None Maternal Substance Abuse:  No Significant Maternal Medications:  None Significant Maternal Lab Results: None and Group B Strep negative  Results for orders placed or performed during the hospital encounter of  02/02/20 (from the past 24 hour(s))  CBC   Collection Time: 02/02/20  6:57 PM  Result Value Ref Range   WBC 12.7 (H) 4.0 - 10.5 K/uL   RBC 4.10 3.87 - 5.11 MIL/uL   Hemoglobin 11.1 (L) 12.0 - 15.0 g/dL   HCT 34.1 (L) 36.0 - 46.0 %   MCV 83.2 80.0 - 100.0 fL   MCH 27.1 26.0 - 34.0 pg   MCHC 32.6 30.0 - 36.0 g/dL   RDW 14.8 11.5 - 15.5 %   Platelets 209 150 - 400 K/uL   nRBC 0.0 0.0 - 0.2 %  Comprehensive metabolic panel   Collection Time: 02/02/20  6:57 PM  Result Value Ref Range   Sodium 135 135 - 145 mmol/L   Potassium 3.9 3.5 - 5.1  mmol/L   Chloride 107 98 - 111 mmol/L   CO2 17 (L) 22 - 32 mmol/L   Glucose, Bld 104 (H) 70 - 99 mg/dL   BUN 5 (L) 6 - 20 mg/dL   Creatinine, Ser 0.74 0.44 - 1.00 mg/dL   Calcium 8.6 (L) 8.9 - 10.3 mg/dL   Total Protein 6.2 (L) 6.5 - 8.1 g/dL   Albumin 2.6 (L) 3.5 - 5.0 g/dL   AST 29 15 - 41 U/L   ALT 29 0 - 44 U/L   Alkaline Phosphatase 152 (H) 38 - 126 U/L   Total Bilirubin 0.5 0.3 - 1.2 mg/dL   GFR calc non Af Amer >60 >60 mL/min   GFR calc Af Amer >60 >60 mL/min   Anion gap 11 5 - 15  Type and screen [place]   Collection Time: 02/02/20  7:01 PM  Result Value Ref Range   ABO/RH(D) O POS    Antibody Screen NEG    Sample Expiration      02/05/2020,2359 Performed at [facility], [place] [address] [place], [place] [address]   Protein / creatinine ratio, urine   Collection Time: 02/02/20  7:16 PM  Result Value Ref Range   Creatinine, Urine 70.76 mg/dL   Total Protein, Urine 48 mg/dL   Protein Creatinine Ratio 0.68 (H) 0.00 - 0.15 mg/mg[Cre]    Patient Active Problem List   Diagnosis Date Noted  . Preeclampsia, third trimester 11/16/2019  . Short cervix affecting pregnancy 09/29/2019  . 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  . Echogenic intracardiac focus of fetus on prenatal ultrasound 09/29/2019  . Supervision of normal first pregnancy, antepartum 08/14/2019  . History of marijuana use 08/14/2019  . Back pain  10/13/2013  . MIGRAINE WITH AURA 12/05/2010    Assessment: Kamiya Acord is a 24 y.o. G1P0 at 61w3dhere for IOL 2/2 to preeclampsia without severe features.  #Labor: Cytotec started @ 2217.  Ordered for every 4 hours 564m p.o. further management in accordance with cervical checks. #Fetal Wellbeing:  Category I #Pain Control: Epidural and IV pain meds #GBS/ID: Negative #COVID: swab pending #MOF: Breast #MOC: Undecided, considering Nexplanon #Anticipated MOD: NSVD #Preeclampsia without severe features: Elevated blood pressures (not severe) and elevated protein creatinine ratio.  Cont to monitor for signs of severe features.   JoGlenna DurandDO, PGY-1 Family Medicine Resident, [facility] Teaching Service  02/02/2020, 10:26 PM    OB FELLOW ATTESTATION  I have seen and examined this patient and edited the above documentation in the resident's note to reflect any changes or updates.  MaAugustin CoupeMD/MP[facility] OB Fellow  02/02/2020, 10:52 PM

## 2020-02-02 NOTE — Telephone Encounter (Signed)
Returned patient call. Reviewed course with patient, she is concerned about possible pre-eclampsia and when/if she will be scheduled for induction. She complains today of ongoing headache for the last few weeks, not acutely worse. Has significantly worsening swelling in her feet and hand numbness today. Given questionable diagnosis of pre-eclampsia (all normal BP except one in MAU at 26 weeks), will have her come to MAU for eval. She is agreeable to this.   Baldemar Lenis, M.D. Attending Center for [employer] [facility])

## 2020-02-02 NOTE — Telephone Encounter (Signed)
Patient sent message via Earleen Reaper, returned phone call. No answer. Left message.    Baldemar Lenis, M.D. Attending Center for [employer] [facility])

## 2020-02-02 NOTE — MAU Note (Signed)
Pt states that Dr. Earlene Plater called her today  and asked her to come in to be evaluated.   Pt reports right hand feeling numb. Pt reports both feet being swollen.   Denies vaginal bleeding or LOF.   Reports +FM

## 2020-02-03 ENCOUNTER — Encounter (HOSPITAL_COMMUNITY): Payer: Self-pay | Admitting: Family Medicine

## 2020-02-03 ENCOUNTER — Inpatient Hospital Stay (HOSPITAL_COMMUNITY): Payer: Medicaid Other | Admitting: Anesthesiology

## 2020-02-03 ENCOUNTER — Encounter: Payer: Medicaid Other | Admitting: Obstetrics & Gynecology

## 2020-02-03 DIAGNOSIS — Z3A37 37 weeks gestation of pregnancy: Secondary | ICD-10-CM

## 2020-02-03 DIAGNOSIS — O1404 Mild to moderate pre-eclampsia, complicating childbirth: Secondary | ICD-10-CM

## 2020-02-03 LAB — CBC
HCT: 33.3 % — ABNORMAL LOW (ref 36.0–46.0)
HCT: 33.6 % — ABNORMAL LOW (ref 36.0–46.0)
Hemoglobin: 10.9 g/dL — ABNORMAL LOW (ref 12.0–15.0)
Hemoglobin: 10.7 g/dL — ABNORMAL LOW (ref 12.0–15.0)
MCH: 27.4 pg (ref 26.0–34.0)
MCH: 27.4 pg (ref 26.0–34.0)
MCHC: 32.7 g/dL (ref 30.0–36.0)
MCHC: 31.8 g/dL (ref 30.0–36.0)
MCV: 83.7 fL (ref 80.0–100.0)
MCV: 86.2 fL (ref 80.0–100.0)
Platelets: 203 10*3/uL (ref 150–400)
Platelets: 198 10*3/uL (ref 150–400)
RBC: 3.98 MIL/uL (ref 3.87–5.11)
RBC: 3.9 MIL/uL (ref 3.87–5.11)
RDW: 14.8 % (ref 11.5–15.5)
RDW: 15.2 % (ref 11.5–15.5)
WBC: 12 10*3/uL — ABNORMAL HIGH (ref 4.0–10.5)
WBC: 12.9 10*3/uL — ABNORMAL HIGH (ref 4.0–10.5)
nRBC: 0 % (ref 0.0–0.2)
nRBC: 0 % (ref 0.0–0.2)

## 2020-02-03 LAB — RPR: RPR Ser Ql: NONREACTIVE

## 2020-02-03 LAB — SARS CORONAVIRUS 2 (TAT 6-24 HRS): SARS Coronavirus 2: NEGATIVE

## 2020-02-03 MED ORDER — SODIUM CHLORIDE (PF) 0.9 % IJ SOLN
INTRAMUSCULAR | Status: DC | PRN
  Administered 2020-02-03: 12 mL/h via EPIDURAL

## 2020-02-03 MED ORDER — PRENATAL MULTIVITAMIN CH
1.0000 | ORAL_TABLET | Freq: Every day | ORAL | Status: DC
  Administered 2020-02-04 – 2020-02-05 (×2): 1 via ORAL
  Filled 2020-02-03 (×2): qty 1

## 2020-02-03 MED ORDER — ONDANSETRON HCL 4 MG/2ML IJ SOLN: 4.0000 mg | INTRAMUSCULAR | Status: DC | PRN

## 2020-02-03 MED ORDER — DIBUCAINE (PERIANAL) 1 % EX OINT: 1.0000 "application " | TOPICAL_OINTMENT | CUTANEOUS | Status: DC | PRN

## 2020-02-03 MED ORDER — MEASLES, MUMPS & RUBELLA VAC IJ SOLR: 0.5000 mL | Freq: Once | INTRAMUSCULAR | Status: DC

## 2020-02-03 MED ORDER — PHENYLEPHRINE 40 MCG/ML (10ML) SYRINGE FOR IV PUSH (FOR BLOOD PRESSURE SUPPORT): 80.0000 ug | PREFILLED_SYRINGE | INTRAVENOUS | Status: DC | PRN

## 2020-02-03 MED ORDER — EPHEDRINE 5 MG/ML INJ: 10.0000 mg | INTRAVENOUS | Status: DC | PRN

## 2020-02-03 MED ORDER — WITCH HAZEL-GLYCERIN EX PADS: 1.0000 "application " | MEDICATED_PAD | CUTANEOUS | Status: DC | PRN

## 2020-02-03 MED ORDER — FENTANYL CITRATE (PF) 100 MCG/2ML IJ SOLN
INTRAMUSCULAR | Status: AC
  Administered 2020-02-03: 100 ug via INTRAVENOUS
  Filled 2020-02-03: qty 2

## 2020-02-03 MED ORDER — BENZOCAINE-MENTHOL 20-0.5 % EX AERO
1.0000 "application " | INHALATION_SPRAY | CUTANEOUS | Status: DC | PRN
  Administered 2020-02-03: 1 via TOPICAL
  Filled 2020-02-03: qty 56

## 2020-02-03 MED ORDER — FENTANYL CITRATE (PF) 100 MCG/2ML IJ SOLN
100.0000 ug | INTRAMUSCULAR | Status: DC | PRN
  Administered 2020-02-03: 05:00:00 100 ug via INTRAVENOUS
  Filled 2020-02-03: qty 2

## 2020-02-03 MED ORDER — COCONUT OIL OIL
1.0000 "application " | TOPICAL_OIL | Status: DC | PRN
  Administered 2020-02-04: 1 via TOPICAL

## 2020-02-03 MED ORDER — TETANUS-DIPHTH-ACELL PERTUSSIS 5-2.5-18.5 LF-MCG/0.5 IM SUSP: 0.5000 mL | Freq: Once | INTRAMUSCULAR | Status: DC

## 2020-02-03 MED ORDER — LACTATED RINGERS IV SOLN
500.0000 mL | Freq: Once | INTRAVENOUS | Status: AC
  Administered 2020-02-03: 07:00:00 500 mL via INTRAVENOUS

## 2020-02-03 MED ORDER — TERBUTALINE SULFATE 1 MG/ML IJ SOLN: 0.2500 mg | Freq: Once | INTRAMUSCULAR | Status: DC | PRN

## 2020-02-03 MED ORDER — ONDANSETRON HCL 4 MG PO TABS: 4.0000 mg | ORAL_TABLET | ORAL | Status: DC | PRN

## 2020-02-03 MED ORDER — SIMETHICONE 80 MG PO CHEW: 80.0000 mg | CHEWABLE_TABLET | ORAL | Status: DC | PRN

## 2020-02-03 MED ORDER — SENNOSIDES-DOCUSATE SODIUM 8.6-50 MG PO TABS
2.0000 | ORAL_TABLET | ORAL | Status: DC
  Administered 2020-02-03 – 2020-02-04 (×2): 2 via ORAL
  Filled 2020-02-03 (×2): qty 2

## 2020-02-03 MED ORDER — ACETAMINOPHEN 325 MG PO TABS
650.0000 mg | ORAL_TABLET | Freq: Four times a day (QID) | ORAL | Status: DC | PRN
  Administered 2020-02-03 – 2020-02-04 (×3): 650 mg via ORAL
  Filled 2020-02-03 (×3): qty 2

## 2020-02-03 MED ORDER — FENTANYL-BUPIVACAINE-NACL 0.5-0.125-0.9 MG/250ML-% EP SOLN
12.0000 mL/h | EPIDURAL | Status: DC | PRN
  Filled 2020-02-03: qty 250

## 2020-02-03 MED ORDER — DIPHENHYDRAMINE HCL 25 MG PO CAPS: 25.0000 mg | ORAL_CAPSULE | Freq: Four times a day (QID) | ORAL | Status: DC | PRN

## 2020-02-03 MED ORDER — DIPHENHYDRAMINE HCL 50 MG/ML IJ SOLN: 12.5000 mg | INTRAMUSCULAR | Status: DC | PRN

## 2020-02-03 MED ORDER — OXYTOCIN 40 UNITS IN NORMAL SALINE INFUSION - SIMPLE MED
1.0000 m[IU]/min | INTRAVENOUS | Status: DC
  Administered 2020-02-03: 2 m[IU]/min via INTRAVENOUS
  Filled 2020-02-03: qty 1000

## 2020-02-03 MED ORDER — IBUPROFEN 600 MG PO TABS
600.0000 mg | ORAL_TABLET | Freq: Three times a day (TID) | ORAL | Status: DC | PRN
  Administered 2020-02-04 – 2020-02-05 (×5): 600 mg via ORAL
  Filled 2020-02-03 (×6): qty 1

## 2020-02-03 NOTE — Lactation Note (Signed)
This note was copied from a baby's chart. Lactation Consultation Note Baby 4 hrs old. Mom holding baby STS. Mom stated the baby has BF well in football position to the Lt. Breast better than the Rt. Breast. Mom has large breast w/everted nipples. Taught hand expression w/colostrum poured out. Praised mom. Newborn behavior, feeding habits, STS, I&O, breast massage, supply and demand discussed. Discussed feeding positions and support as well as safety while feeding. Mom encouraged to feed baby 8-12 times/24 hours and with feeding cues.  Mom encouraged to waken baby for feedings if hasn't cued in 3 hrs. Encouraged mom to call for assistance or questions.  Lactation brochure given.  Patient Name: Claire Nash [id number] Date: 02/03/2020 Reason for consult: Initial assessment;Primapara;Early term 37-38.6wks   Maternal Data Has patient been taught Hand Expression?: Yes Does the patient have breastfeeding experience prior to this delivery?: No  Feeding Feeding Type: Breast Fed  LATCH Score Latch: Grasps breast easily, tongue down, lips flanged, rhythmical sucking.  Audible Swallowing: A few with stimulation  Type of Nipple: Everted at rest and after stimulation  Comfort (Breast/Nipple): Soft / non-tender  Hold (Positioning): Assistance needed to correctly position infant at breast and maintain latch.  LATCH Score: 8  Interventions Interventions: Breast feeding basics reviewed;Position options;Skin to skin;Breast massage;Hand express;Breast compression;Support pillows  Lactation Tools Discussed/Used     Consult Status Consult Status: Follow-up Date: 02/04/20 Follow-up type: In-patient    Charyl Dancer 02/03/2020, 11:25 PM

## 2020-02-03 NOTE — Anesthesia Preprocedure Evaluation (Signed)
Anesthesia Evaluation  Patient identified by MRN, date of birth, ID band Patient awake    Reviewed: Allergy & Precautions, Patient's Chart, lab work & pertinent test results  Airway Mallampati: II  TM Distance: >3 FB Neck ROM: Full    Dental   Pulmonary neg pulmonary ROS,    Pulmonary exam normal breath sounds clear to auscultation       Cardiovascular hypertension, Normal cardiovascular exam Rhythm:Regular Rate:Normal     Neuro/Psych  Headaches, negative psych ROS   GI/Hepatic negative GI ROS, Neg liver ROS,   Endo/Other  negative endocrine ROS  Renal/GU negative Renal ROS     Musculoskeletal negative musculoskeletal ROS (+)   Abdominal   Peds  Hematology negative hematology ROS (+)   Anesthesia Other Findings   Reproductive/Obstetrics (+) Pregnancy                            Anesthesia Physical Anesthesia Plan  ASA: II  Anesthesia Plan: Epidural   Post-op Pain Management:    Induction:   PONV Risk Score and Plan:   Airway Management Planned:   Additional Equipment:   Intra-op Plan:   Post-operative Plan:   Informed Consent: I have reviewed the patients History and Physical, chart, labs and discussed the procedure including the risks, benefits and alternatives for the proposed anesthesia with the patient or authorized representative who has indicated his/her understanding and acceptance.       Plan Discussed with:   Anesthesia Plan Comments:         Anesthesia Quick Evaluation

## 2020-02-03 NOTE — Progress Notes (Signed)
LABOR PROGRESS NOTE  Claire Nash is a 24 y.o. G1P0 at [redacted]w[redacted]d here for IOL 2/2 to preeclampsia without severe features.  Subjective: Patient resting comfortably in bed after epidural. Pitocin starting now.  Objective: BP 135/77   Pulse (!) 108   Temp 98.6 F (37 C) (Oral)   Resp 20   Ht 5\' 7"  (1.702 m)   Wt 107.5 kg   LMP 05/16/2019 (Exact Date)   SpO2 100%   BMI 37.12 kg/m    Dilation: 5 Effacement (%): 70 Cervical Position: Posterior Station: -2 Presentation: Vertex Exam by:: foley,rn Fetal monitoring: Baseline: 135 bpm, Variability: Good {> 6 bpm), Accelerations: absent, and Decelerations: Absent Uterine activity: Frequency: Every 3-5 minutes, Duration: 90 seconds, and Intensity: moderate  Labs: Lab Results  Component Value Date   WBC 12.9 (H) 02/03/2020   HGB 10.7 (L) 02/03/2020   HCT 33.6 (L) 02/03/2020   MCV 86.2 02/03/2020   PLT 198 02/03/2020    Patient Active Problem List   Diagnosis Date Noted  . Preeclampsia, third trimester 11/16/2019  . Short cervix affecting pregnancy 09/29/2019  . 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  . Echogenic intracardiac focus of fetus on prenatal ultrasound 09/29/2019  . Supervision of normal first pregnancy, antepartum 08/14/2019  . History of marijuana use 08/14/2019  . Back pain 10/13/2013  . MIGRAINE WITH AURA 12/05/2010    Assessment / Plan: Claire Nash is a 24 y.o. G1P0 at [redacted]w[redacted]d here for IOL 2/2 to preeclampsia without severe features.  #Labor: S/p cyto x , FB. Pitocin starting now. #Fetal Wellbeing:  Category I #Pain Control: Labor support without medications and Epidural #ID: GBS Negative #Preeclampsia without severe features: normotensive most recently 135/77, mildly elevated earlier this morning to 130-140s/70-90s. Patient remains asymptomatic, continue to monitor.  #Anticipated MOD: NSVD  [redacted]w[redacted]d, MD [facility] Family Medicine Residency, PGY-1 02/03/2020, 9:34 AM

## 2020-02-03 NOTE — Discharge Summary (Signed)
Postpartum Discharge Summary      Patient Name: Claire Nash DOB: 30-Jun-1996 MRN: [id number]  Date of admission: 02/02/2020 Delivering Provider: Gladys Damme   Date of discharge: 02/05/2020  Admitting diagnosis: Preeclampsia, third trimester [O14.93] Intrauterine pregnancy: [redacted]w[redacted]d    Secondary diagnosis:  Active Problems:   Migraine with aura   Supervision of normal first pregnancy, antepartum   Short cervix affecting pregnancy   Genetic carrier   Echogenic intracardiac focus of fetus on prenatal ultrasound   Preeclampsia, third trimester   Nexplanon in place  Additional problems: None     Discharge diagnosis: Term Pregnancy Delivered and Preeclampsia (mild)                                                                                                Post partum procedures:Nexplanon placement  Augmentation: AROM, Pitocin, Cytotec and Foley Balloon  Complications: None  Hospital course:  Induction of Labor With Vaginal Delivery   24y.o. yo G1P0 at 328w4das admitted to the hospital 02/02/2020 for induction of labor.  Indication for induction: Preeclampsia.  Patient had an uncomplicated labor course as follows: Patient admitted with CVE closed, long, high, she received one dose of cytotec and a foley bulb and CVE changed to 5/70/-2. Pitocin started and CVE increased to 6.5/70/-2. Patient AROMed with continued pitocin titration. Patient had recurrent late decels, pitocin cut in half. Strip improved and pitocin titration restarted. Patient then progressed to complete.  Membrane Rupture Time/Date: 1:44 PM ,02/03/2020   Intrapartum Procedures: Episiotomy: None [1]                                         Lacerations:  Periurethral [8];1st degree [2];Perineal [11]  Patient had delivery of a Viable infant.  Information for the patient's newborn:  McNura, [place]0[[id number]]    02/03/2020  Details of delivery can be found in separate delivery note.  Patient had a routine  postpartum course. BP's monitored and patient started on Procardia XL 30 mg with better control; she is to f/u outpatient. Nexplanon placed. Patient with headaches post-partum and reports hx of migraines. BP's well-controlled at that time. Patient prescribed Fioricet and Magnesium-Oxide to take at home. Patient is discharged home 02/05/20. Delivery time: 7:09 PM    Magnesium Sulfate received: No BMZ received: No Rhophylac:No MMR:No Transfusion:No  Physical exam  Vitals:   02/04/20 0858 02/04/20 1452 02/04/20 2231 02/05/20 0520  BP: 124/76 134/75 126/84 124/81  Pulse: 98 100 96 89  Resp: 18 (!) 23 18   Temp: 98.3 F (36.8 C) 98.5 F (36.9 C) 98.1 F (36.7 C) 98.3 F (36.8 C)  TempSrc: Oral Oral Oral Axillary  SpO2: 98% 100%  100%  Weight:      Height:       General: alert, cooperative and no distress Lochia: appropriate Uterine Fundus: firm Incision: N/A DVT Evaluation: No evidence of DVT seen on physical exam. Labs: Lab Results  Component Value Date   WBC 12.9 (  H) 02/03/2020   HGB 10.7 (L) 02/03/2020   HCT 33.6 (L) 02/03/2020   MCV 86.2 02/03/2020   PLT 198 02/03/2020   CMP Latest Ref Rng & Units 02/02/2020  Glucose 70 - 99 mg/dL 104(H)  BUN 6 - 20 mg/dL 5(L)  Creatinine 0.44 - 1.00 mg/dL 0.74  Sodium 135 - 145 mmol/L 135  Potassium 3.5 - 5.1 mmol/L 3.9  Chloride 98 - 111 mmol/L 107  CO2 22 - 32 mmol/L 17(L)  Calcium 8.9 - 10.3 mg/dL 8.6(L)  Total Protein 6.5 - 8.1 g/dL 6.2(L)  Total Bilirubin 0.3 - 1.2 mg/dL 0.5  Alkaline Phos 38 - 126 U/L 152(H)  AST 15 - 41 U/L 29  ALT 0 - 44 U/L 29   Edinburgh Score: Edinburgh Postnatal Depression Scale Screening Tool 02/04/2020  I have been able to laugh and see the funny side of things. 0  I have looked forward with enjoyment to things. 1  I have blamed myself unnecessarily when things went wrong. 0  I have been anxious or worried for no good reason. 2  I have felt scared or panicky for no good reason. 0  Things have  been getting on top of me. 2  I have been so unhappy that I have had difficulty sleeping. 1  I have felt sad or miserable. 1  I have been so unhappy that I have been crying. 1  The thought of harming myself has occurred to me. 0  Edinburgh Postnatal Depression Scale Total 8    Discharge instruction: per After Visit Summary and "Baby and Me Booklet".  After visit meds:  Allergies as of 02/05/2020   No Known Allergies     Medication List    STOP taking these medications   acetaminophen 500 MG tablet Commonly known as: TYLENOL     TAKE these medications   Blood Pressure Monitor Kit 1 kit by Does not apply route once a week.   butalbital-acetaminophen-caffeine 50-325-40 MG tablet Commonly known as: FIORICET Take 1-2 tablets by mouth every 6 (six) hours as needed for headache.   cyclobenzaprine 10 MG tablet Commonly known as: FLEXERIL Take 1 tablet (10 mg total) by mouth 3 (three) times daily as needed (Migarines).   ferrous sulfate 325 (65 FE) MG tablet Take 1 tablet (325 mg total) by mouth every other day. Start taking on: February 06, 2020   ibuprofen 600 MG tablet Commonly known as: ADVIL Take 1 tablet (600 mg total) by mouth every 8 (eight) hours as needed for mild pain.   magnesium oxide 400 (241.3 Mg) MG tablet Commonly known as: MAG-OX Take 1 tablet (400 mg total) by mouth daily.   NIFEdipine 30 MG 24 hr tablet Commonly known as: ADALAT CC Take 1 tablet (30 mg total) by mouth daily.   prenatal multivitamin Tabs tablet Take 1 tablet by mouth daily at 12 noon.       Diet: routine diet  Activity: Advance as tolerated. Pelvic rest for 6 weeks.   Outpatient follow up:4 weeks Follow up Appt: Future Appointments  Date Time Provider [place]  02/10/2020  1:15 PM Brand[place] None  03/02/2020  1:00 PM Yanette Tripoli, Marin Shutter, MD [facility] None   Follow up Visit:    Please schedule this patient for Postpartum visit in: 4 weeks with the following  provider: Any provider Virtual High risk pregnancy complicated by: HTN Delivery mode:  SVD Anticipated Birth Control:  Nexplanon PP Procedures needed: BP check  Schedule Integrated BH  visit: no     Newborn Data: Live born female  Birth Weight: 3016g  APGAR: 57, 9  Newborn Delivery   Birth date/time: 02/03/2020 19:09:00 Delivery type: Vaginal, Spontaneous      Baby Feeding: Breast Disposition:home with mother   02/05/2020 Chauncey Mann, MD

## 2020-02-03 NOTE — Progress Notes (Signed)
Nyah Shepherd MRN: [id number]  Subjective: -Nurse reports patient with recurrent variables.  Provider to bedside to assess. Patient resting in bed.  Reports taking a nap and feeling refreshed.  FOB at bedside and supportive.   Objective: BP (!) 117/44   Pulse (!) 132   Temp 98.3 F (36.8 C) (Oral)   Resp 20   Ht 5\' 7"  (1.702 m)   Wt 107.5 kg   LMP 05/16/2019 (Exact Date)   SpO2 100%   BMI 37.12 kg/m  No intake/output data recorded. No intake/output data recorded.  Fetal Monitoring: FHT: 145 bpm, Mod Var, + Late Decels, +Accels UC: Q2-20min    Vaginal Exam: SVE:   Dilation: 6.5 Effacement (%): 70 Station: -2 Exam by:: J.Romano Stigger,CNM Membranes:AROM with small amt clear fluid Internal Monitors: IUPC inserted  Augmentation/Induction: Pitocin:58mUn/min Cytotec: S/P  Assessment:  IUP at 37.4 weeks Cat II FT  IOL d/t PreEclampsia Amniotomy  Plan: -Discussed AROM r/b including increased risk of infection, cord prolapse, fetal intolerance, and decreased labor time. No questions or concerns and patient desires to proceed with AROM.  -Discussed IUPC r/b, prior to insertion, including increased risk of infection and ability to adequately monitor quantity and strength of contractions. -Cat II FT remains and pitocin to be decreased to 32mUn/min. -Continue other mgmt as ordered -Dr. 4m updated on patient status  Alfonso Patten, CNM Advanced Practice Provider, [facility] 02/03/2020, 2:01 PM

## 2020-02-03 NOTE — Progress Notes (Signed)
LABOR PROGRESS NOTE  Claire Nash is a 24 y.o. G1P0 at [redacted]w[redacted]d here for IOL 2/2 to preeclampsia without severe features.  Subjective: Patient sleeping comfortably since last encounter.  Discussed benefit of Foley bulb placement, patient amenable.  Objective: BP 125/76   Pulse 99   Temp 98.2 F (36.8 C) (Oral)   Resp 16   LMP 05/16/2019 (Exact Date)   SpO2 100%    Dilation: 2 Effacement (%): 50 Cervical Position: Posterior Station: -3 Presentation: Vertex Exam by:: Zack Seal, MD Fetal monitoring: Baseline: 135 bpm, Variability: Good {> 6 bpm), Accelerations: Reactive, and Decelerations: Absent Uterine activity: Frequency: Every 2-5 minutes, Duration: 90 seconds, and Intensity: moderate  Labs: Lab Results  Component Value Date   WBC 12.0 (H) 02/03/2020   HGB 10.9 (L) 02/03/2020   HCT 33.3 (L) 02/03/2020   MCV 83.7 02/03/2020   PLT 203 02/03/2020    Patient Active Problem List   Diagnosis Date Noted  . Preeclampsia, third trimester 11/16/2019  . Short cervix affecting pregnancy 09/29/2019  . 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  . Echogenic intracardiac focus of fetus on prenatal ultrasound 09/29/2019  . Supervision of normal first pregnancy, antepartum 08/14/2019  . History of marijuana use 08/14/2019  . Back pain 10/13/2013  . MIGRAINE WITH AURA 12/05/2010    Assessment / Plan: Claire Nash is a 24 y.o. G1P0 at [redacted]w[redacted]d here for IOL 2/2 to preeclampsia without severe features.  #Labor: Cytotec started @ 2217 2/9. S/p FB placed @ 0300. Consider starting Pitocin next check. #Fetal Wellbeing:  Category I #Pain Control: Labor support without medications and Epidural #ID: GBS Negative #Preeclampsia without severe features: Elevated blood pressures (not severe) and elevated protein creatinine ratio.  Cont to monitor for signs of severe features. Last BP is 141/76. #Anticipated MOD: NSVD   Leonides Cave, DO, PGY-1 Family Medicine Resident, [facility] Faculty Teaching Service   02/03/2020, 3:10 AM

## 2020-02-03 NOTE — Anesthesia Procedure Notes (Signed)
Epidural Patient location during procedure: OB Start time: 02/03/2020 8:07 AM End time: 02/03/2020 8:17 AM  Staffing Anesthesiologist: Lewie Loron, MD Performed: anesthesiologist   Preanesthetic Checklist Completed: patient identified, IV checked, risks and benefits discussed, monitors and equipment checked, pre-op evaluation and timeout performed  Epidural Patient position: sitting Prep: DuraPrep and site prepped and draped Patient monitoring: heart rate, continuous pulse ox and blood pressure Approach: midline Location: L3-L4 Injection technique: LOR air and LOR saline  Needle:  Needle type: Tuohy  Needle gauge: 17 G Needle length: 9 cm Needle insertion depth: 7 cm Catheter type: closed end flexible Catheter size: 19 Gauge Catheter at skin depth: 12 cm Test dose: negative  Assessment Sensory level: T8 Events: blood not aspirated, injection not painful, no injection resistance, no paresthesia and negative IV test  Additional Notes Reason for block:procedure for pain

## 2020-02-04 ENCOUNTER — Encounter (HOSPITAL_COMMUNITY): Payer: Self-pay | Admitting: Family Medicine

## 2020-02-04 DIAGNOSIS — Z975 Presence of (intrauterine) contraceptive device: Secondary | ICD-10-CM

## 2020-02-04 DIAGNOSIS — Z30017 Encounter for initial prescription of implantable subdermal contraceptive: Secondary | ICD-10-CM

## 2020-02-04 MED ORDER — ETONOGESTREL 68 MG ~~LOC~~ IMPL
68.0000 mg | DRUG_IMPLANT | Freq: Once | SUBCUTANEOUS | Status: AC
  Administered 2020-02-04: 20:00:00 68 mg via SUBCUTANEOUS
  Filled 2020-02-04: qty 1

## 2020-02-04 MED ORDER — OXYCODONE HCL 5 MG PO TABS
5.0000 mg | ORAL_TABLET | ORAL | Status: DC | PRN
  Administered 2020-02-04: 5 mg via ORAL
  Administered 2020-02-05: 01:00:00 10 mg via ORAL
  Filled 2020-02-04 (×3): qty 1

## 2020-02-04 MED ORDER — LIDOCAINE HCL 1 % IJ SOLN
0.0000 mL | Freq: Once | INTRAMUSCULAR | Status: AC | PRN
  Administered 2020-02-04: 20:00:00 20 mL via INTRADERMAL
  Filled 2020-02-04: qty 20

## 2020-02-04 MED ORDER — NIFEDIPINE ER OSMOTIC RELEASE 30 MG PO TB24
30.0000 mg | ORAL_TABLET | Freq: Every day | ORAL | Status: DC
  Administered 2020-02-04 – 2020-02-05 (×2): 30 mg via ORAL
  Filled 2020-02-04 (×2): qty 1

## 2020-02-04 MED ORDER — FERROUS SULFATE 325 (65 FE) MG PO TABS
325.0000 mg | ORAL_TABLET | ORAL | Status: DC
  Administered 2020-02-04: 12:00:00 325 mg via ORAL
  Filled 2020-02-04: qty 1

## 2020-02-04 NOTE — Clinical Social Work Maternal (Addendum)
CLINICAL SOCIAL WORK MATERNAL/CHILD NOTE  Patient Details  Name: Claire Nash MRN: [id number] Date of Birth: 05/02/1996  Date:  02/04/2020  Clinical Social Worker Initiating Note:  Shenicka Sunderlin, LCSW Date/Time: Initiated:  02/04/20/0900     Child's Name:  Claire Nash   Biological Parents:  Mother, Father(Claire Nash (MOB), Claire Nash (FOB))   Need for Interpreter:      Reason for Referral:      Address:  [address] [place] [address]    Phone number:  [phone] (home)     Additional phone number:none   Household Members/Support Persons (HM/SP):   Household Member/Support Person 1, Household Member/Support Person 2   HM/SP Name Relationship DOB or Age  HM/SP -1 Claire Nash MOB  23  HM/SP -2 Claire Nash      HM/SP -3  Claire Nash    sister    HM/SP -4   sister      HM/SP -5        HM/SP -6        HM/SP -7        HM/SP -8          Natural Supports (not living in the home):      Professional Supports: None   Employment: Full-time   Type of Work: [employer]   Education:  High school graduate   Homebound arranged:  n/a  Financial Resources:  Medicaid   Other Resources:  Food Stamps , WIC   Cultural/Religious Considerations Which May Impact Care:  none reported.   Strengths:  Ability to meet basic needs , Compliance with medical plan , Home prepared for child , Pediatrician chosen   Psychotropic Medications:     none reported.     Pediatrician:    [place] area  Pediatrician List:   [facility]  [place]    [place]    [place]    [place]    [place]      Pediatrician Fax Number:    Risk Factors/Current Problems:      Cognitive State:  Insightful , Able to Concentrate , Alert    Mood/Affect:  Relaxed , Comfortable , Calm , Interested    CSW Assessment: CSW consulted as MOB reported THC use ear;ier in pregnancy. CSW went to speak with  her at bedside to address further needs.   CSW congratulated MOB and FOB on the birth of infant. CSW advised MOB of CSW's role and the HIPPA policy. MOB and FOB both agreeable to FOB leaving the room while CSW spoke with MOB. CSW advised MOB of the reason for CSW coming to visit with her. MOB reported that she did use THC up until she was 3 months as she was unable to eat and keep food down. MOB reported that once she hit her three month mark of pregnancy, she quit smoking THC as she was finally able to eat and keep the food down. CSW understanding but also advised MOB of hospital drug screen policy. CSW advised MOB of what would happen if infants UDS or CDS was positive,. MOB reported that she did not use any other substances during pregnancy and reported that she understood.    CSW assessed MOB for Mental health. MOB reports that she has no mental health diagnosis. MOB denies SI and HI at this time as well DV. MOB reported that she has all needed items to care for infant with no other needs at this time.  CSW   took time to provide MOB with PPD and SODS education. MOB was given PPD Checkilist in order for her to keep track of feelings as they relate to PPD.   CSW will continue to monitor infants CDS and UDS to make CPS report if warranted.    CSW Plan/Description:  No Further Intervention Required/No Barriers to Discharge, Sudden Infant Death Syndrome (SIDS) Education, Perinatal Mood and Anxiety Disorder (PMADs) Education, CSW Will Continue to Monitor Umbilical Cord Tissue Drug Screen Results and Make Report if Warranted, Hospital Drug Screen Policy Information    Claire Nash, LCSWA 02/04/2020, 9:42 AM  

## 2020-02-04 NOTE — Progress Notes (Signed)
POSTPARTUM PROGRESS NOTE  Post Partum Day 1  Subjective:  Claire Nash is a 24 y.o. G1P1001 s/p NSVD at [redacted]w[redacted]d.  She reports she is doing well. No acute events overnight. She denies any problems with ambulating, voiding or po intake. Denies nausea or vomiting.  Pain is moderately controlled.  Lochia is appropriate.  Objective: Blood pressure (!) 138/97, pulse (!) 101, temperature 98.4 F (36.9 C), temperature source Axillary, resp. rate 18, height 5\' 7"  (1.702 m), weight 107.5 kg, last menstrual period 05/16/2019, SpO2 100 %, unknown if currently breastfeeding.  Physical Exam:  General: alert, cooperative and no distress Chest: no respiratory distress Heart:regular rate, distal pulses intact Abdomen: soft, nontender,  Uterine Fundus: firm, appropriately tender DVT Evaluation: No calf swelling or tenderness Extremities: trace LE edema Skin: warm, dry  Recent Labs    02/03/20 0117 02/03/20 0708  HGB 10.9* 10.7*  HCT 33.3* 33.6*    Assessment/Plan: Claire Nash is a 24 y.o. G1P1001 s/p NSVD at [redacted]w[redacted]d   PPD#1 - Doing well  Routine postpartum care Written for oxycodone as ibuprofen/tylenol only moderately controlling pain, encouraged to use Contraception: still undecided but leaning towards Nexplanon, readdress tomorrow Feeding: Breast Dispo: Plan for discharge PPD#2.  PreE w/o SF: cont to have mild range BP's since delivery, start Nifedipine 30 XL  Anemia: admission hgb 10.7, EBL at delivery 725, start PO ferrous sulfate every other day   LOS: 2 days   [redacted]w[redacted]d, MD/[facility] OB Fellow  02/04/2020, 7:17 AM

## 2020-02-04 NOTE — Procedures (Signed)
  HPI:  Pt desires Nexplanon.  Risks/benefits/side effects of Nexplanon have been discussed and her questions have been answered.  Specifically, a failure rate of 12/998 has been reported, with an increased failure rate if pt takes St. John's Wort and/or antiseizure medicaitons.  Kiki Bivens is aware of the common side effect of irregular bleeding, which the incidence of decreases over time.   Past Medical History: Past Medical History:  Diagnosis Date  . Chronic pelvic pain in female 04/23/2013  . Migraines   . Short cervix     Past Surgical History: Past Surgical History:  Procedure Laterality Date  . URETHRA SURGERY      Family History: Family History  Problem Relation Age of Onset  . Hypertension Mother   . Fibroids Mother   . Hypertension Father   . Asthma Father   . Hypertension Maternal Grandmother   . Drug abuse Maternal Grandmother   . Drug abuse Maternal Grandfather   . Breast cancer Maternal Aunt     Social History: Social History   Tobacco Use  . Smoking status: Never Smoker  . Smokeless tobacco: Never Used  Substance Use Topics  . Alcohol use: Not Currently    Comment: occ  . Drug use: Not Currently    Types: Marijuana    Comment: Last use June 2020    Allergies: No Known Allergies    Her left arm, approximatly 4 inches proximal from the elbow, was cleansed with alcohol and anesthetized with 2cc of 2% Lidocaine.  The area was cleansed again and the Nexplanon was inserted without difficulty.  A pressure bandage was applied.  Pt was instructed to remove pressure bandage in a few hours, and keep insertion site covered with a bandaid for 3 days.   Jacklyn Shell 02/04/2020 7:49 PM

## 2020-02-05 MED ORDER — BUTALBITAL-APAP-CAFFEINE 50-325-40 MG PO TABS: 1.0000 | ORAL_TABLET | Freq: Four times a day (QID) | ORAL | 0 refills | Status: AC | PRN

## 2020-02-05 MED ORDER — MAGNESIUM OXIDE 400 (241.3 MG) MG PO TABS
400.0000 mg | ORAL_TABLET | Freq: Every day | ORAL | Status: DC
  Administered 2020-02-05: 10:00:00 400 mg via ORAL
  Filled 2020-02-05: qty 1

## 2020-02-05 MED ORDER — FERROUS SULFATE 325 (65 FE) MG PO TABS: 325.0000 mg | ORAL_TABLET | ORAL | 0 refills | Status: DC

## 2020-02-05 MED ORDER — BUTALBITAL-APAP-CAFFEINE 50-325-40 MG PO TABS
2.0000 | ORAL_TABLET | Freq: Four times a day (QID) | ORAL | Status: DC | PRN
  Administered 2020-02-05: 2 via ORAL
  Filled 2020-02-05: qty 2

## 2020-02-05 MED ORDER — NIFEDIPINE ER 30 MG PO TB24: 30.0000 mg | ORAL_TABLET | Freq: Every day | ORAL | 0 refills | Status: DC

## 2020-02-05 MED ORDER — MAGNESIUM OXIDE 400 (241.3 MG) MG PO TABS: 400.0000 mg | ORAL_TABLET | Freq: Every day | ORAL | 0 refills | Status: AC

## 2020-02-05 MED ORDER — IBUPROFEN 600 MG PO TABS: 600.0000 mg | ORAL_TABLET | Freq: Three times a day (TID) | ORAL | 0 refills | Status: DC | PRN

## 2020-02-05 MED FILL — NIFEdipine ER 30 MG TB24: 30 | 90 days supply | Qty: 90 | Fill #0

## 2020-02-05 MED FILL — BUTALB-ACETAMIN-CAFF 50-325: 50-325-40 | 5 days supply | Qty: 20 | Fill #0

## 2020-02-05 MED FILL — MAGNESIUM OXIDE 400 MG TABS: 400 | 30 days supply | Qty: 30 | Fill #0

## 2020-02-05 MED FILL — FERROUS SULFATE 325 MG TAB: 325 (65 FE) | 60 days supply | Qty: 60 | Fill #0

## 2020-02-05 MED FILL — IBUPROFEN 600 MG TABLET: 600 | 10 days supply | Qty: 30 | Fill #0

## 2020-02-05 NOTE — Anesthesia Postprocedure Evaluation (Signed)
Anesthesia Post Note  Patient: Claire Nash  Procedure(s) Performed: AN AD HOC LABOR EPIDURAL     Patient location during evaluation: Mother Baby Anesthesia Type: Epidural Level of consciousness: awake, awake and alert and oriented Pain management: pain level controlled Vital Signs Assessment: post-procedure vital signs reviewed and stable Respiratory status: spontaneous breathing, nonlabored ventilation and respiratory function stable Cardiovascular status: stable Postop Assessment: no headache, patient able to bend at knees, no apparent nausea or vomiting, adequate PO intake, able to ambulate and no backache Anesthetic complications: no    Last Vitals:  Vitals:   02/04/20 2231 02/05/20 0520  BP: 126/84 124/81  Pulse: 96 89  Resp: 18   Temp: 36.7 C 36.8 C  SpO2:  100%    Last Pain:  Vitals:   02/05/20 0620  TempSrc:   PainSc: Asleep   Pain Goal:                   Addam Goeller

## 2020-02-05 NOTE — Progress Notes (Signed)
Dr. Morene Antu called RN Percival Spanish reporting pt still had a headache mostly untouched by Oxycodone 10 mg. Fiorcet ordered. When RN went to administer medication pt was resting well. RN checked in on pt several times, but everyone had a 2+ hour nap. Fiorcet given at [phone] with Motrin 600 mg for a headache with 4/10 pain that returned after rest.   Elvia Collum, RN 02/05/20

## 2020-02-05 NOTE — Lactation Note (Signed)
This note was copied from a baby's chart. Lactation Consultation Note  Patient Name: Claire Nash Date: 02/05/2020 Reason for consult: Follow-up assessment;Early term 37-38.6wks;Primapara Baby is 39 hours old/6% weight loss.  Mom reports that feedings are going well.  Baby has started to cluster feed.  Discussed milk coming to volume and the prevention and treatment of engorgement.  Mom does not have a pump at home so a manual pump given with instructions on use, cleaning and EBM storage.  No questions or concerns.  Reviewed outpatient services and encouraged to call prn.  Maternal Data    Feeding    LATCH Score                   Interventions Interventions: Hand pump  Lactation Tools Discussed/Used     Consult Status Consult Status: Complete Follow-up type: Call as needed    Huston Foley 02/05/2020, 10:17 AM

## 2020-02-10 ENCOUNTER — Other Ambulatory Visit: Payer: Self-pay

## 2020-02-10 ENCOUNTER — Ambulatory Visit: Payer: Medicaid Other

## 2020-02-10 VITALS — BP 129/77 | HR 106

## 2020-02-10 DIAGNOSIS — Z0130 Encounter for examination of blood pressure without abnormal findings: Secondary | ICD-10-CM

## 2020-02-10 NOTE — Progress Notes (Signed)
..  Subjective:  Claire Nash is a 24 y.o. female here for BP check.   Hypertension ROS: taking medications as instructed, no medication side effects noted, no TIA's, no chest pain on exertion, no dyspnea on exertion and no swelling of ankles.    Objective:  BP 129/77   Pulse (!) 106   LMP 05/16/2019 (Exact Date)   Breastfeeding Yes   Appearance alert, well appearing, and in no distress. General exam BP noted to be well controlled today in office.    Assessment:   Blood Pressure well controlled. Pt reports still having occasional headaches, but relieved by pain med.   Plan:  Current treatment plan is effective, no change in therapy.. PP visit scheduled for 03-02-20.

## 2020-02-10 NOTE — Progress Notes (Signed)
Patient ID: Claire Nash, female   DOB: 1996/06/01, 24 y.o.   MRN: [id number] Patient seen and assessed by nursing staff during this encounter. I have reviewed the chart and agree with the documentation and plan.  Scheryl Darter, MD 02/10/2020 2:35 PM

## 2020-02-16 DIAGNOSIS — G43919 Migraine, unspecified, intractable, without status migrainosus: Secondary | ICD-10-CM

## 2020-03-02 ENCOUNTER — Encounter: Payer: Self-pay | Admitting: Family Medicine

## 2020-03-02 ENCOUNTER — Ambulatory Visit (INDEPENDENT_AMBULATORY_CARE_PROVIDER_SITE_OTHER): Payer: Medicaid Other | Admitting: Family Medicine

## 2020-03-02 ENCOUNTER — Other Ambulatory Visit: Payer: Self-pay

## 2020-03-02 DIAGNOSIS — R519 Headache, unspecified: Secondary | ICD-10-CM

## 2020-03-02 MED ORDER — IBUPROFEN 600 MG PO TABS: 600.0000 mg | ORAL_TABLET | Freq: Three times a day (TID) | ORAL | 0 refills | Status: DC | PRN

## 2020-03-02 NOTE — Progress Notes (Signed)
Subjective:     Claire Nash is a 24 y.o. female who presents for a postpartum visit. She is 4 weeks postpartum following a spontaneous vaginal delivery. I have fully reviewed the prenatal and intrapartum course. The delivery was at 37.4 gestational weeks. Outcome: spontaneous vaginal delivery. Anesthesia: epidural. Postpartum course has been Unremarkable. Baby's course has been Unremarkable. Baby is feeding by breast. Bleeding thin lochia. Bowel function is constipated. Bladder function is normal. Patient is not sexually active. Contraception method is none. Postpartum depression screening: negative=0. Patient reports some discomfort where her sutures are that is worse when she sits up.  Also reports a headache almost every day since delivery. Denies vision changes. She has a history of migraines but normally they were not this frequent. Minimal spotting still present.   Denies fever, chills, cough, SOB, chest pain, abdominal pain, nausea, vomiting, diarrhea, constipation, dysuria, hematuria, hematochezia, melena, difficulty moving arms/legs, speech difficulty, trouble eating, confusion or any other complaints.   The following portions of the patient's history were reviewed and updated as appropriate: allergies, current medications, past family history, past medical history, past social history, past surgical history and problem list.  Review of Systems Pertinent items noted in HPI and remainder of comprehensive ROS otherwise negative.   Objective:    BP 132/77   Pulse 88   Ht 5\' 7"  (1.702 m)   Wt 202 lb 3.2 oz (91.7 kg)   LMP 05/16/2019 (Exact Date)   Breastfeeding Yes   BMI 31.67 kg/m   General:  alert, cooperative and appears stated age   Breasts:  deferred  Lungs: normal effort  Heart:  regular rate  Abdomen: soft, non-tender   Vulva:  normal  Vagina: normal vagina. Sutures visualized at pelvic floor and bilaterally from labial repair. Sutures removed. Patient with relief after  removal.   Cervix:  not examined  Corpus: not examined  Adnexa:  not evaluated  Rectal Exam: Not performed.        Assessment:     Routine ostpartum exam. Pap smear not done at today's visit.   Patient with continued headaches despite taking Mag Ox and PRN medications.  Hx of Pre-E and patient was started on Procardia in the hospital.   Plan:    1. Contraception: Nexplanon. Patient desired removal due to headaches. Advised that likely not due to Nexplanon and will trial plan below for headaches; patient agreeable.  2. Pre-E: Patient started on Procardia. BP's WNL. Headaches could be a likely side effect of Procardia. Will discontinue and requested BP check in 1 week as patient has cuff at home; consider alternative if elevated.  3. Headaches: Could be secondary to hx of migraines, med side effect or unlikely Pre-E with normal BP's. Patient already has an appt with Neuro on 3/30. Will trial discontinuation of Procardia to see if this helps.  4. Follow up as needed. BP check in 1 week requested.   Future Appointments  Date Time Provider Department Center  03/08/2020  1:15 PM [facility] NURSE [facility] None  03/22/2020  3:00 PM 03/24/2020, MD [facility] None   Levert Feinstein, MD [facility] Fellow, [facility]

## 2020-03-08 ENCOUNTER — Telehealth (INDEPENDENT_AMBULATORY_CARE_PROVIDER_SITE_OTHER): Payer: Medicaid Other

## 2020-03-08 VITALS — BP 138/87 | HR 75

## 2020-03-08 DIAGNOSIS — Z0130 Encounter for examination of blood pressure without abnormal findings: Secondary | ICD-10-CM

## 2020-03-08 NOTE — Progress Notes (Signed)
Agree with A & P. 

## 2020-03-08 NOTE — Progress Notes (Signed)
Subjective:  Claire Nash is a 24 y.o. female connecting over the phone for BP check.   Hypertension ROS: not currently prescribed to take medication, no TIA's, no chest pain on exertion, no dyspnea on exertion and no swelling of ankles.    Objective:  BP 138/87   Pulse 75   LMP 05/16/2019 (Exact Date)   Breastfeeding Yes     Assessment:   Blood Pressure well controlled. Pt reports still having occasional headaches and has an appointment with the headache specialist on 03/22/20.   Plan:  Current treatment plan is effective, no change in therapy. Advised pt to find a PCP and establish care in case of needing blood pressure management in the future, pt voices understanding.

## 2020-03-22 ENCOUNTER — Encounter: Payer: Self-pay | Admitting: Neurology

## 2020-03-22 ENCOUNTER — Telehealth: Payer: Self-pay

## 2020-03-22 ENCOUNTER — Ambulatory Visit: Payer: Self-pay | Admitting: Neurology

## 2020-03-22 NOTE — Telephone Encounter (Signed)
PT was a no show for new pt appt.

## 2020-04-05 ENCOUNTER — Ambulatory Visit (INDEPENDENT_AMBULATORY_CARE_PROVIDER_SITE_OTHER): Payer: Medicaid Other | Admitting: Obstetrics & Gynecology

## 2020-04-05 ENCOUNTER — Other Ambulatory Visit: Payer: Self-pay

## 2020-04-05 ENCOUNTER — Encounter: Payer: Self-pay | Admitting: Obstetrics & Gynecology

## 2020-04-05 VITALS — BP 124/83 | HR 87 | Wt 202.7 lb

## 2020-04-05 DIAGNOSIS — Z3046 Encounter for surveillance of implantable subdermal contraceptive: Secondary | ICD-10-CM | POA: Diagnosis not present

## 2020-04-05 DIAGNOSIS — N939 Abnormal uterine and vaginal bleeding, unspecified: Secondary | ICD-10-CM | POA: Diagnosis not present

## 2020-04-05 DIAGNOSIS — R519 Headache, unspecified: Secondary | ICD-10-CM | POA: Diagnosis not present

## 2020-04-05 MED ORDER — LEVONORGESTREL-ETHINYL ESTRAD 0.1-20 MG-MCG PO TABS: 1.0000 | ORAL_TABLET | Freq: Every day | ORAL | 11 refills | Status: DC

## 2020-04-05 NOTE — Progress Notes (Signed)
Nexplanon Removal Procedure Note  Patient given informed consent for removal of her Nexplanon due to headaches and heavy vaginal bleeding. Time out was performed and signed copy scanning into the chart. Nexplanon site identified on left arm. Area prepped in usual sterile fashon. One cc of 1% lidocaine was used to anesthetize the area at the distal end of the implant. A small stab incision was made w/ an 11 blade scalpel at the distal portion end. The Nexplanon rod was grasped using hemostats and removed without difficulty. Removal of entire 40 mm rod confirmed and shown to patient. There was less than 3 cc blood loss. There were no complications. A pressure bandage was applied to reduce any bruising. The patient tolerated the procedure well and was given post procedure instructions. [facility] sent to pharmacy.   Malachy Chamber, MD 04/05/2020 3:02 PM

## 2020-04-05 NOTE — Patient Instructions (Signed)
Etonogestrel implant What is this medicine? ETONOGESTREL (et oh noe JES trel) is a contraceptive (birth control) device. It is used to prevent pregnancy. It can be used for up to 3 years. This medicine may be used for other purposes; ask your health care provider or pharmacist if you have questions. COMMON BRAND NAME(S): Implanon, Nexplanon What should I tell my health care provider before I take this medicine? They need to know if you have any of these conditions:  abnormal vaginal bleeding  blood vessel disease or blood clots  breast, cervical, endometrial, ovarian, liver, or uterine cancer  diabetes  gallbladder disease  heart disease or recent heart attack  high blood pressure  high cholesterol or triglycerides  kidney disease  liver disease  migraine headaches  seizures  stroke  tobacco smoker  an unusual or allergic reaction to etonogestrel, anesthetics or antiseptics, other medicines, foods, dyes, or preservatives  pregnant or trying to get pregnant  breast-feeding How should I use this medicine? This device is inserted just under the skin on the inner side of your upper arm by a health care professional. Talk to your pediatrician regarding the use of this medicine in children. Special care may be needed. Overdosage: If you think you have taken too much of this medicine contact a poison control center or emergency room at once. NOTE: This medicine is only for you. Do not share this medicine with others. What if I miss a dose? This does not apply. What may interact with this medicine? Do not take this medicine with any of the following medications:  amprenavir  fosamprenavir This medicine may also interact with the following medications:  acitretin  aprepitant  armodafinil  bexarotene  bosentan  carbamazepine  certain medicines for fungal infections like fluconazole, ketoconazole, itraconazole and voriconazole  certain medicines to treat  hepatitis, HIV or AIDS  cyclosporine  felbamate  griseofulvin  lamotrigine  modafinil  oxcarbazepine  phenobarbital  phenytoin  primidone  rifabutin  rifampin  rifapentine  St. John's wort  topiramate This list may not describe all possible interactions. Give your health care provider a list of all the medicines, herbs, non-prescription drugs, or dietary supplements you use. Also tell them if you smoke, drink alcohol, or use illegal drugs. Some items may interact with your medicine. What should I watch for while using this medicine? This product does not protect you against HIV infection (AIDS) or other sexually transmitted diseases. You should be able to feel the implant by pressing your fingertips over the skin where it was inserted. Contact your doctor if you cannot feel the implant, and use a non-hormonal birth control method (such as condoms) until your doctor confirms that the implant is in place. Contact your doctor if you think that the implant may have broken or become bent while in your arm. You will receive a user card from your health care provider after the implant is inserted. The card is a record of the location of the implant in your upper arm and when it should be removed. Keep this card with your health records. What side effects may I notice from receiving this medicine? Side effects that you should report to your doctor or health care professional as soon as possible:  allergic reactions like skin rash, itching or hives, swelling of the face, lips, or tongue  breast lumps, breast tissue changes, or discharge  breathing problems  changes in emotions or moods  coughing up blood  if you feel that the implant   may have broken or bent while in your arm  high blood pressure  pain, irritation, swelling, or bruising at the insertion site  scar at site of insertion  signs of infection at the insertion site such as fever, and skin redness, pain or  discharge  signs and symptoms of a blood clot such as breathing problems; changes in vision; chest pain; severe, sudden headache; pain, swelling, warmth in the leg; trouble speaking; sudden numbness or weakness of the face, arm or leg  signs and symptoms of liver injury like dark yellow or brown urine; general ill feeling or flu-like symptoms; light-colored stools; loss of appetite; nausea; right upper belly pain; unusually weak or tired; yellowing of the eyes or skin  unusual vaginal bleeding, discharge Side effects that usually do not require medical attention (report to your doctor or health care professional if they continue or are bothersome):  acne  breast pain or tenderness  headache  irregular menstrual bleeding  nausea This list may not describe all possible side effects. Call your doctor for medical advice about side effects. You may report side effects to FDA at 1-800-FDA-1088. Where should I keep my medicine? This drug is given in a hospital or clinic and will not be stored at home. NOTE: This sheet is a summary. It may not cover all possible information. If you have questions about this medicine, talk to your doctor, pharmacist, or health care provider.  2020 Elsevier/Gold Standard (2019-09-22 11:33:04)  

## 2020-04-05 NOTE — Progress Notes (Signed)
Pt is here for Nexplanon removal, she reports experiencing headaches and vaginal bleeding since it was placed 2 months ago. Pt does not desire any other form of contraception at this time.

## 2020-10-01 IMAGING — US US MFM OB FOLLOW-UP
1 series · 13 of 28 positions shown · non-contrast
Comparison: none

[Series 1: us mfm ob follow-up · 13 of 47 slices shown]
[im 2/47]
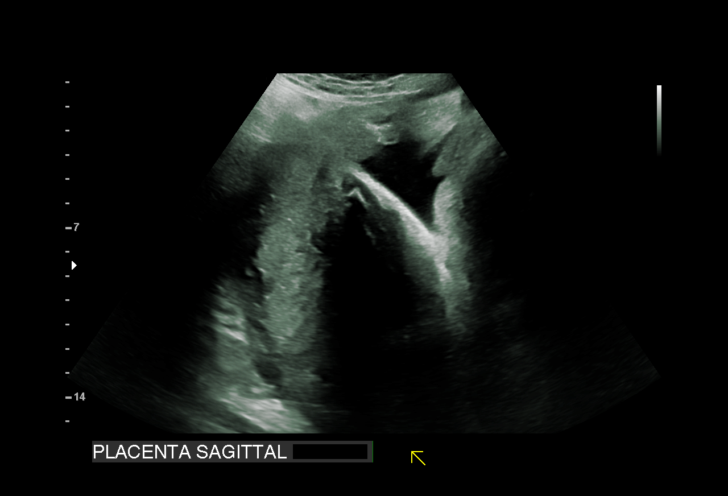
[im 6/47]
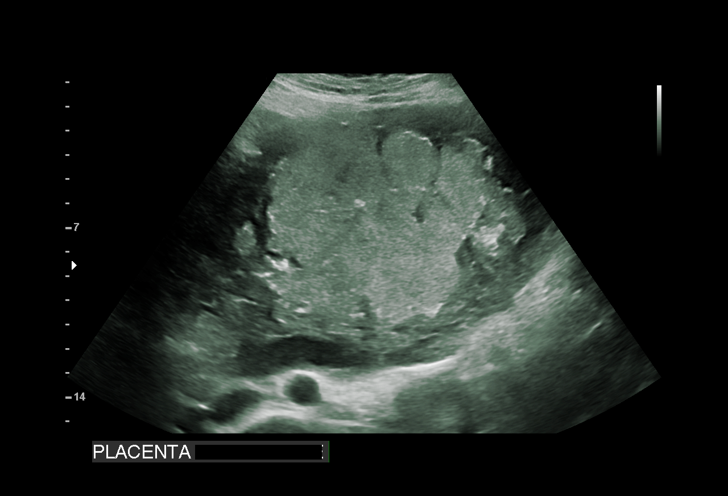
[im 9/47]
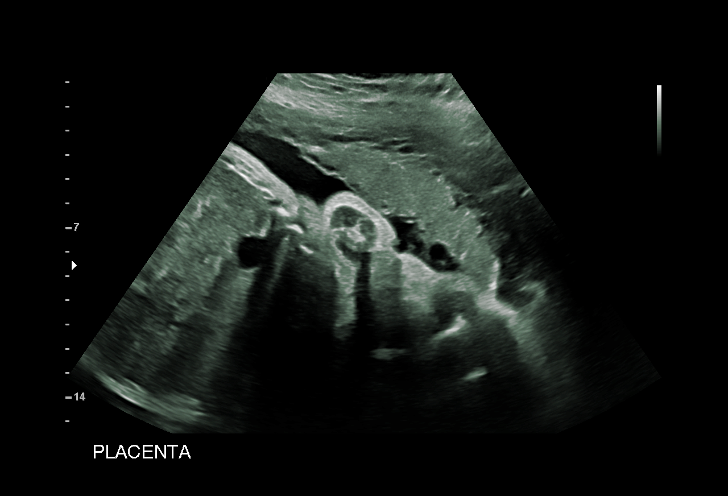
[im 12/47]
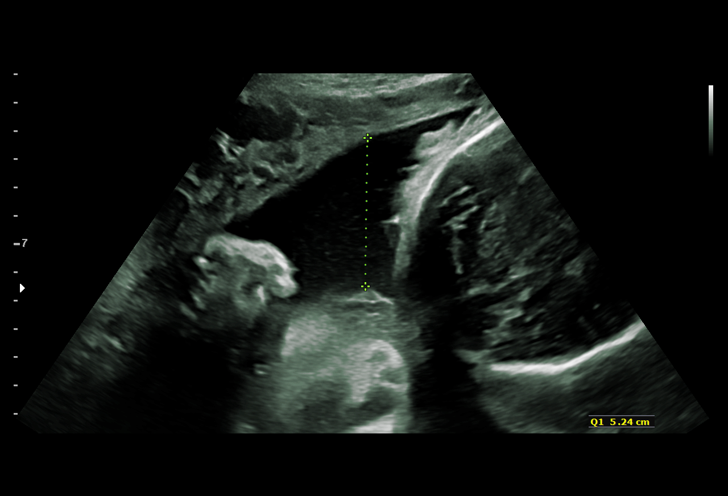
[im 16/47]
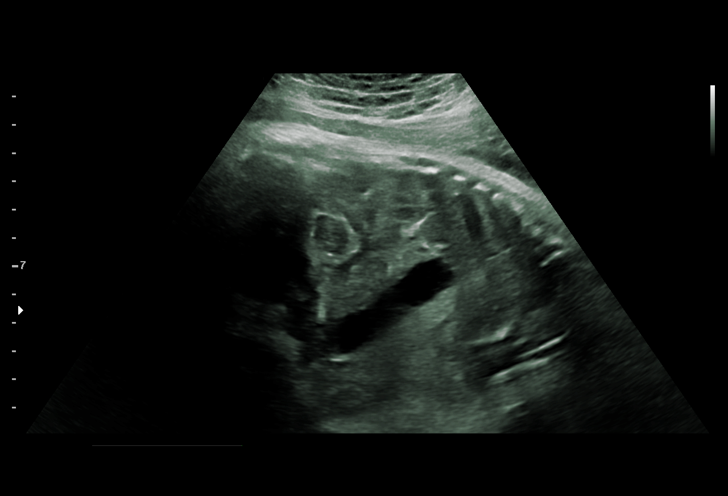
[im 19/47]
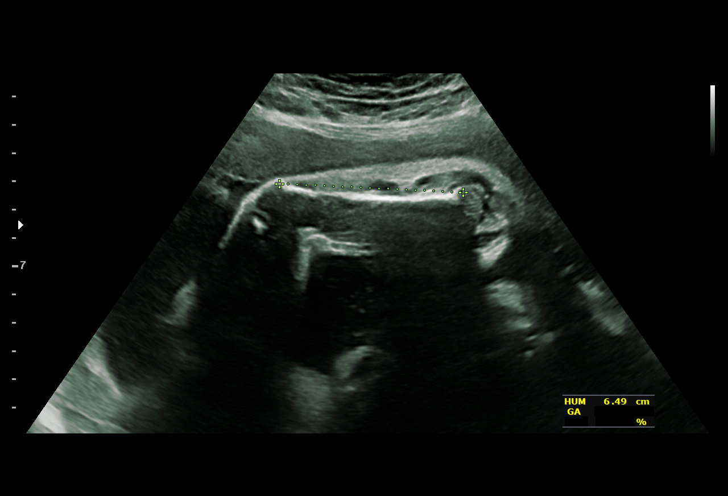
[im 24/47]
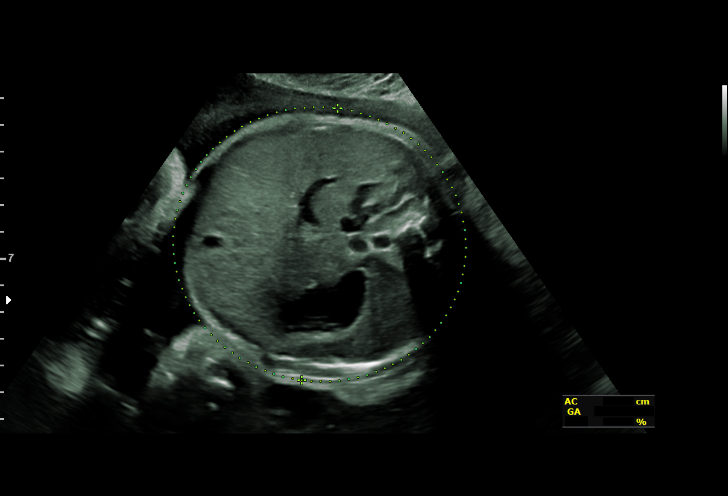
[im 28/47]
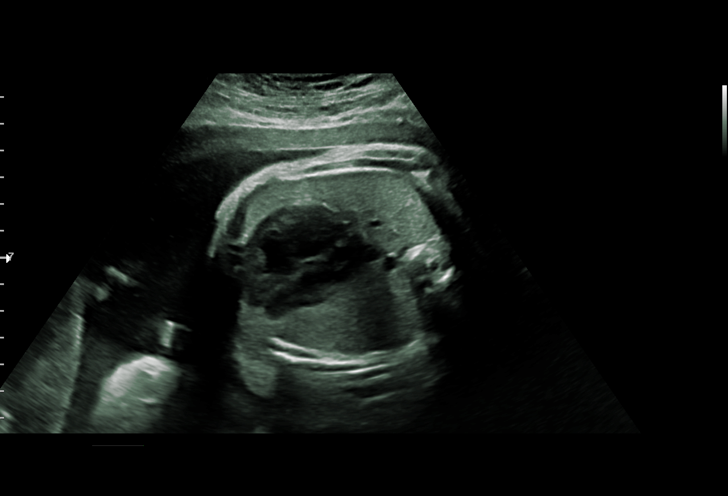
[im 31/47]
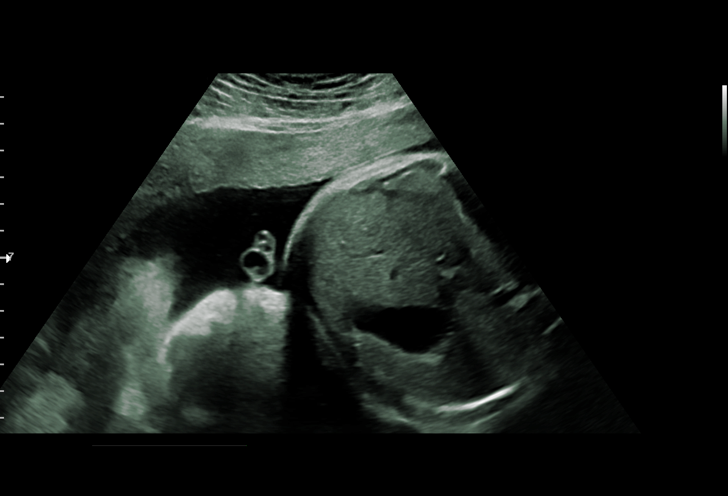
[im 35/47]
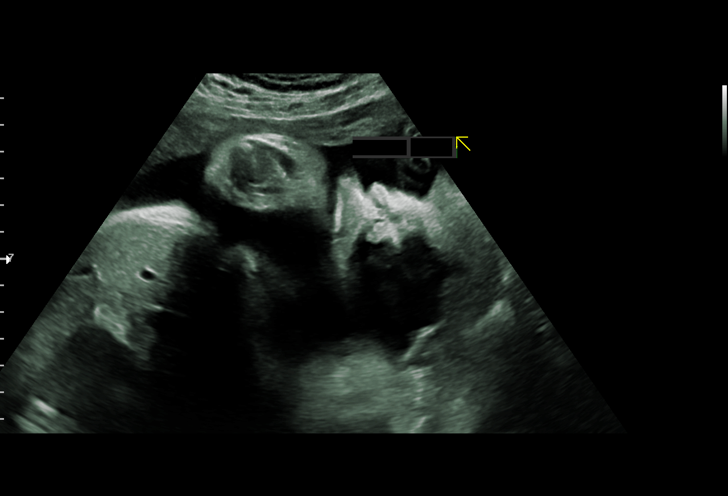
[im 38/47]
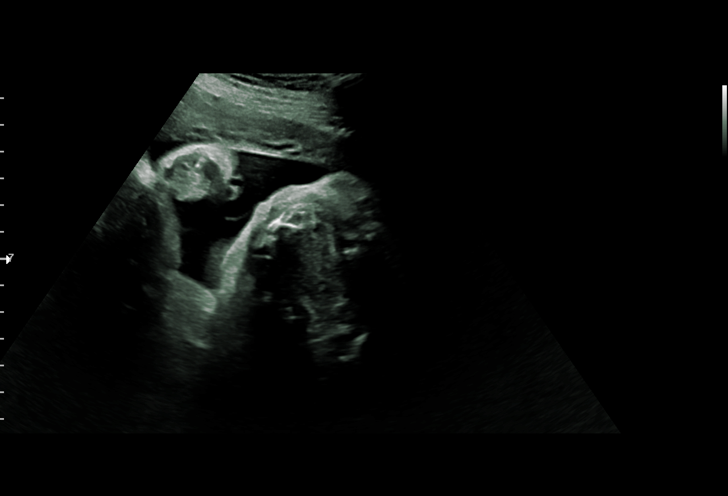
[im 41/47]
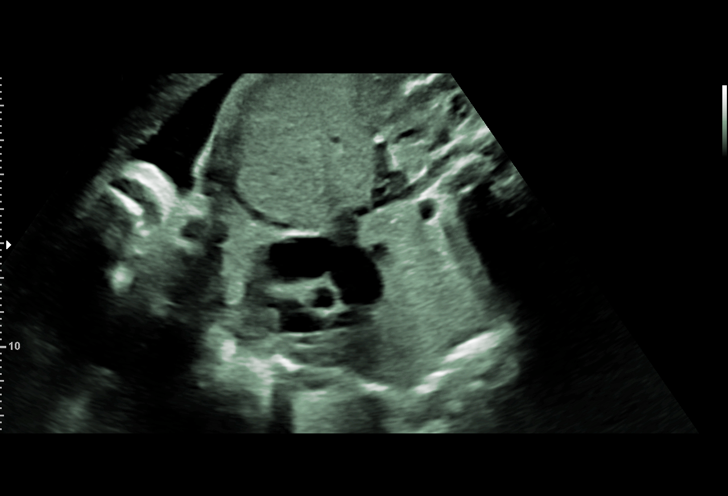
[im 45/47]
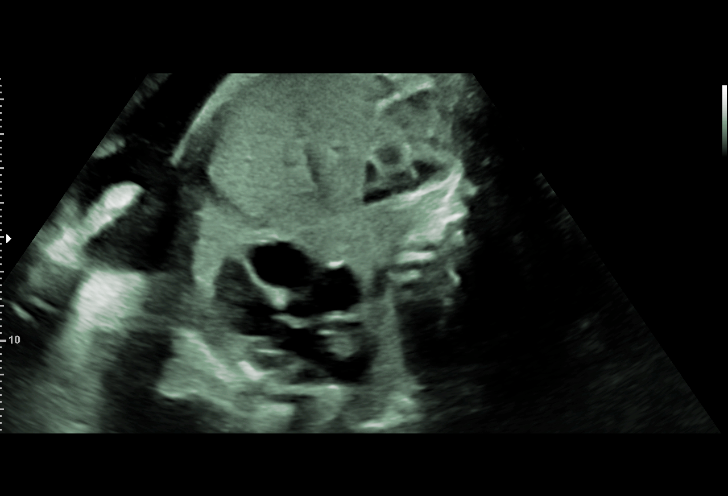

[13 of 28 positions shown; findings below may reference images not displayed]

[address]

 ----------------------------------------------------------------------

 ----------------------------------------------------------------------
Indications

  Fetal abnormality - other known or
  suspected (LVEIF)  RESOLVED
  36 weeks gestation of pregnancy
  Genetic carrier (Spinal Muscular Atrophy)
  Marijuana Use - in early pregnancy
  Encounter for antenatal screening for
  malformations (low risk panorama)
  Cervical shortening complicating pregnancy
  Pre-eclampsia (diagnosed 12/02/19)
 ----------------------------------------------------------------------
Vital Signs

                                                Height:        5'6"
Fetal Evaluation

 Num Of Fetuses:         1
 Fetal Heart Rate(bpm):  144
 Cardiac Activity:       Observed
 Presentation:           Cephalic
 Placenta:               Fundal LEFT

 Amniotic Fluid
 AFI FV:      Within normal limits

 AFI Sum(cm)     %Tile       Largest Pocket(cm)
 16.98           64

 RUQ(cm)                     LUQ(cm)        LLQ(cm)

Biometry
 BPD:      89.7  mm     G. Age:  36w 2d         55  %    CI:        76.72   %    70 - 86
                                                         FL/HC:      22.9   %    20.8 -
 HC:      324.4  mm     G. Age:  36w 5d         25  %    HC/AC:      0.98        0.92 -
 AC:      330.1  mm     G. Age:  36w 6d         72  %    FL/BPD:     82.7   %    71 - 87
 FL:       74.2  mm     G. Age:  38w 0d         81  %    FL/AC:      22.5   %    20 - 24
 HUM:      63.8  mm     G. Age:  37w 0d         75  %

 Est. FW:    3557  gm    6 lb 14 oz      68  %
OB History

 Gravidity:    1
Gestational Age

 LMP:           36w 4d        Date:  05/16/19                 EDD:   02/20/20
 U/S Today:     37w 0d                                        EDD:   02/17/20
 Best:          36w 4d     Det. By:  LMP  (05/16/19)          EDD:   02/20/20
Anatomy

 Cranium:               Appears normal         LVOT:                   Appears normal
 Cavum:                 Previously seen        Aortic Arch:            Previously seen
 Ventricles:            Appears normal         Ductal Arch:            Previously seen
 Choroid Plexus:        Previously seen        Diaphragm:              Appears normal
 Cerebellum:            Previously seen        Stomach:                Appears normal, left
                                                                       sided
 Posterior Fossa:       Previously seen        Abdomen:                Appears normal
 Nuchal Fold:           Previously seen        Abdominal Wall:         Appears nml (cord
                                                                       insert, abd wall)
 Face:                  Appears normal         Cord Vessels:           Appears normal (3
                        (orbits and profile)                           vessel cord)
 Lips:                  Appears normal         Kidneys:                Appear normal
 Palate:                Appears normal         Bladder:                Appears normal
 Thoracic:              Appears normal         Spine:                  Previously seen
 Heart:                 Echogenic focus        Upper Extremities:      Previously seen
                        in LV RESOLVED
 RVOT:                  Appears normal         Lower Extremities:      Previously seen

 Other:  . Heels visualized previously. 5th digit visualized previously.
Cervix Uterus Adnexa

 Cervix
 Not visualized (advanced GA >26wks)
Impression

 Patient return for fetal growth assessment.  She had a
 questionable diagnosis of preeclampsia based on one high
 blood pressure reading in [REDACTED].  She does not have
 symptoms of severe features of preeclampsia.  Her blood
 pressures have been normal at prenatal visits.  She was
 counseled by her provider and the decision was made to not
 to deliver at 37 weeks.
 BP today at our office is 137/77 mmHg.

 Amniotic fluid is normal and good fetal activity is seen. Fetal
 growth is appropriate for gestational age.  Cephalic
 presentation.
Recommendations

 Follow-up scans as clinically indicated.
                 Widera, Bielecka

## 2020-10-27 ENCOUNTER — Emergency Department (HOSPITAL_BASED_OUTPATIENT_CLINIC_OR_DEPARTMENT_OTHER)
Admission: EM | Admit: 2020-10-27 | Discharge: 2020-10-27 | Disposition: A | Discharge status: Home / Self Care | Payer: Medicaid Other | Attending: Emergency Medicine | Admitting: Emergency Medicine

## 2020-10-27 ENCOUNTER — Other Ambulatory Visit: Payer: Self-pay

## 2020-10-27 ENCOUNTER — Emergency Department (HOSPITAL_BASED_OUTPATIENT_CLINIC_OR_DEPARTMENT_OTHER): Payer: Medicaid Other

## 2020-10-27 ENCOUNTER — Other Ambulatory Visit (HOSPITAL_BASED_OUTPATIENT_CLINIC_OR_DEPARTMENT_OTHER): Payer: Self-pay | Admitting: Emergency Medicine

## 2020-10-27 ENCOUNTER — Encounter (HOSPITAL_BASED_OUTPATIENT_CLINIC_OR_DEPARTMENT_OTHER): Payer: Self-pay

## 2020-10-27 DIAGNOSIS — R1030 Lower abdominal pain, unspecified: Secondary | ICD-10-CM

## 2020-10-27 DIAGNOSIS — R1031 Right lower quadrant pain: Secondary | ICD-10-CM | POA: Insufficient documentation

## 2020-10-27 DIAGNOSIS — R1032 Left lower quadrant pain: Secondary | ICD-10-CM | POA: Insufficient documentation

## 2020-10-27 DIAGNOSIS — M545 Low back pain, unspecified: Secondary | ICD-10-CM | POA: Insufficient documentation

## 2020-10-27 LAB — CBC WITH DIFFERENTIAL/PLATELET
Abs Immature Granulocytes: 0.01 10*3/uL (ref 0.00–0.07)
Basophils Absolute: 0 10*3/uL (ref 0.0–0.1)
Basophils Relative: 1 %
Eosinophils Absolute: 0.2 10*3/uL (ref 0.0–0.5)
Eosinophils Relative: 2 %
HCT: 38.9 % (ref 36.0–46.0)
Hemoglobin: 13 g/dL (ref 12.0–15.0)
Immature Granulocytes: 0 %
Lymphocytes Relative: 35 %
Lymphs Abs: 2.3 10*3/uL (ref 0.7–4.0)
MCH: 30 pg (ref 26.0–34.0)
MCHC: 33.4 g/dL (ref 30.0–36.0)
MCV: 89.6 fL (ref 80.0–100.0)
Monocytes Absolute: 0.5 10*3/uL (ref 0.1–1.0)
Monocytes Relative: 8 %
Neutro Abs: 3.5 10*3/uL (ref 1.7–7.7)
Neutrophils Relative %: 54 %
Platelets: 171 10*3/uL (ref 150–400)
RBC: 4.34 MIL/uL (ref 3.87–5.11)
RDW: 13.6 % (ref 11.5–15.5)
WBC: 6.5 10*3/uL (ref 4.0–10.5)
nRBC: 0 % (ref 0.0–0.2)

## 2020-10-27 LAB — URINALYSIS, ROUTINE W REFLEX MICROSCOPIC
Bilirubin Urine: NEGATIVE
Glucose, UA: NEGATIVE mg/dL
Hgb urine dipstick: NEGATIVE
Ketones, ur: NEGATIVE mg/dL
Leukocytes,Ua: NEGATIVE
Nitrite: NEGATIVE
Protein, ur: NEGATIVE mg/dL
Specific Gravity, Urine: >1.03 — ABNORMAL HIGH (ref 1.005–1.030)
pH: 6 (ref 5.0–8.0)

## 2020-10-27 LAB — COMPREHENSIVE METABOLIC PANEL
ALT: 14 U/L (ref 0–44)
AST: 15 U/L (ref 15–41)
Albumin: 3.9 g/dL (ref 3.5–5.0)
Alkaline Phosphatase: 65 U/L (ref 38–126)
Anion gap: 9 (ref 5–15)
BUN: 10 mg/dL (ref 6–20)
CO2: 23 mmol/L (ref 22–32)
Calcium: 8.8 mg/dL — ABNORMAL LOW (ref 8.9–10.3)
Chloride: 105 mmol/L (ref 98–111)
Creatinine, Ser: 0.8 mg/dL (ref 0.44–1.00)
GFR, Estimated: >60 mL/min (ref >60)
Glucose, Bld: 95 mg/dL (ref 70–99)
Potassium: 3.7 mmol/L (ref 3.5–5.1)
Sodium: 137 mmol/L (ref 135–145)
Total Bilirubin: 0.5 mg/dL (ref 0.3–1.2)
Total Protein: 7.1 g/dL (ref 6.5–8.1)

## 2020-10-27 LAB — PREGNANCY, URINE: Preg Test, Ur: NEGATIVE

## 2020-10-27 LAB — LIPASE, BLOOD: Lipase: 23 U/L (ref 11–51)

## 2020-10-27 MED ORDER — NAPROXEN 500 MG PO TABS: 500.0000 mg | ORAL_TABLET | Freq: Two times a day (BID) | ORAL | 0 refills | Status: DC

## 2020-10-27 MED ORDER — IOHEXOL 300 MG/ML  SOLN
100.0000 mL | Freq: Once | INTRAMUSCULAR | Status: AC | PRN
  Administered 2020-10-27: 100 mL via INTRAVENOUS

## 2020-10-27 MED FILL — NAPROXEN 500 MG TABS: 500 | 7 days supply | Qty: 14 | Fill #0

## 2020-10-27 NOTE — Discharge Instructions (Signed)
Take the Naprosyn as needed for pain.  Make an appointment to follow-up with your OB/GYN doctor.  Today's work-up to include lab work pregnancy test urinalysis and CT scan of the abdomen without any acute findings.

## 2020-10-27 NOTE — ED Triage Notes (Signed)
Pt arrives with c/o abdominal paind and back pain for 2 months, worsening over the last few days. Pt reports her urine "has been very strong".

## 2020-10-27 NOTE — ED Notes (Signed)
ED Provider at bedside. 

## 2020-10-27 NOTE — ED Provider Notes (Signed)
[facility] DEPARTMENT Provider Note   CSN: [id number] Arrival date & time: 10/27/20  6599     History Chief Complaint  Patient presents with  . Abdominal Pain    Claire Nash is a 24 y.o. female.  Patient with complaint of lower quadrant abdominal pain bilaterally for 4 days.  States pain is constant in nature.  Associated with some low back pain for several weeks.  Patient denies any fever dysuria vaginal discharge vaginal bleeding nausea or vomiting.        Past Medical History:  Diagnosis Date  . Chronic pelvic pain in female 04/23/2013  . Migraines   . Short cervix     Patient Active Problem List   Diagnosis Date Noted  . Nexplanon in place 02/04/2020  . Preeclampsia, third trimester 11/16/2019  . 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  . History of marijuana use 08/14/2019  . Back pain 10/13/2013  . Migraine with aura 12/05/2010    Past Surgical History:  Procedure Laterality Date  . URETHRA SURGERY       OB History    Gravida  1   Para  1   Term  1   Preterm      AB      Living  1     SAB      TAB      Ectopic      Multiple  0   Live Births  1           Family History  Problem Relation Age of Onset  . Hypertension Mother   . Fibroids Mother   . Hypertension Father   . Asthma Father   . Hypertension Maternal Grandmother   . Drug abuse Maternal Grandmother   . Drug abuse Maternal Grandfather   . Breast cancer Maternal Aunt     Social History   Tobacco Use  . Smoking status: Never Smoker  . Smokeless tobacco: Never Used  Vaping Use  . Vaping Use: Never used  Substance Use Topics  . Alcohol use: Not Currently    Comment: occ  . Drug use: Not Currently    Types: Marijuana    Comment: Last use June 2020    Home Medications Prior to Admission medications   Medication Sig Start Date End Date Taking? Authorizing Provider  Blood Pressure Monitor KIT 1 kit by Does not apply route once a week. Patient not  taking: Reported on 03/08/2020 11/05/19   Shelly Bombard, MD  butalbital-acetaminophen-caffeine [facility]) [phone] MG tablet Take 1-2 tablets by mouth every 6 (six) hours as needed for headache. 02/05/20 02/04/21  Fair, Marin Shutter, MD  cyclobenzaprine (FLEXERIL) 10 MG tablet Take 1 tablet (10 mg total) by mouth 3 (three) times daily as needed (Migarines). Patient not taking: Reported on 02/10/2020 11/16/19   Rasch, Anderson Malta I, NP  ferrous sulfate 325 (65 FE) MG tablet Take 1 tablet (325 mg total) by mouth every other day. 02/06/20   Fair, Marin Shutter, MD  ibuprofen (ADVIL) 600 MG tablet Take 1 tablet (600 mg total) by mouth every 8 (eight) hours as needed for mild pain. Patient not taking: Reported on 04/05/2020 03/02/20   Chauncey Mann, MD  levonorgestrel-ethinyl estradiol (ALESSE) 0.1-20 MG-MCG tablet Take 1 tablet by mouth daily. 04/05/20 04/05/21  Cherre Blanc, MD  naproxen (NAPROSYN) 500 MG tablet Take 1 tablet (500 mg total) by mouth 2 (two) times daily. 10/27/20   Fredia Sorrow, MD  Prenatal Vit-Fe Fumarate-FA (PRENATAL MULTIVITAMIN) TABS  tablet Take 1 tablet by mouth daily at 12 noon.    [provider]    Allergies    Patient has no known allergies.  Review of Systems   Review of Systems  Constitutional: Negative for chills and fever.  HENT: Negative for congestion, rhinorrhea and sore throat.   Eyes: Negative for visual disturbance.  Respiratory: Negative for cough and shortness of breath.   Cardiovascular: Negative for chest pain and leg swelling.  Gastrointestinal: Positive for abdominal pain. Negative for diarrhea, nausea and vomiting.  Genitourinary: Negative for dysuria, vaginal bleeding and vaginal discharge.  Musculoskeletal: Negative for back pain and neck pain.  Skin: Negative for rash.  Neurological: Negative for dizziness, light-headedness and headaches.  Hematological: Does not bruise/bleed easily.  Psychiatric/Behavioral: Negative for confusion.     Physical Exam Updated Vital Signs BP 134/71 (BP Location: Right Arm)   Pulse 76   Temp 98.4 F (36.9 C) (Oral)   Resp 16   Ht 1.676 m (5' 6")   Wt 90.6 kg   LMP 10/21/2020   SpO2 100%   BMI 32.23 kg/m   Physical Exam Vitals and nursing note reviewed.  Constitutional:      General: She is not in acute distress.    Appearance: Normal appearance. She is well-developed.  HENT:     Head: Normocephalic and atraumatic.  Eyes:     Extraocular Movements: Extraocular movements intact.     Conjunctiva/sclera: Conjunctivae normal.     Pupils: Pupils are equal, round, and reactive to light.  Cardiovascular:     Rate and Rhythm: Normal rate and regular rhythm.     Heart sounds: No murmur heard.   Pulmonary:     Effort: Pulmonary effort is normal. No respiratory distress.     Breath sounds: Normal breath sounds.  Abdominal:     General: There is no distension.     Palpations: Abdomen is soft.     Tenderness: There is no abdominal tenderness. There is no guarding.  Musculoskeletal:        General: No swelling. Normal range of motion.     Cervical back: Normal range of motion and neck supple.  Skin:    General: Skin is warm and dry.     Capillary Refill: Capillary refill takes less than 2 seconds.  Neurological:     General: No focal deficit present.     Mental Status: She is alert and oriented to person, place, and time.     ED Results / Procedures / Treatments   Labs (all labs ordered are listed, but only abnormal results are displayed) Labs Reviewed  URINALYSIS, ROUTINE W REFLEX MICROSCOPIC - Abnormal; Notable for the following components:      Result Value   Specific Gravity, Urine >1.030 (*)    All other components within normal limits  COMPREHENSIVE METABOLIC PANEL - Abnormal; Notable for the following components:   Calcium 8.8 (*)    All other components within normal limits  PREGNANCY, URINE  LIPASE, BLOOD  CBC WITH DIFFERENTIAL/PLATELET     EKG None  Radiology CT Abdomen Pelvis W Contrast  Result Date: 10/27/2020 CLINICAL DATA:  Right lower quadrant abdominal pain. Clinical suspicion for appendicitis. EXAM: CT ABDOMEN AND PELVIS WITH CONTRAST TECHNIQUE: Multidetector CT imaging of the abdomen and pelvis was performed using the standard protocol following bolus administration of intravenous contrast. CONTRAST:  166m OMNIPAQUE IOHEXOL 300 MG/ML  SOLN COMPARISON:  None. FINDINGS: Lower chest: Unremarkable. Hepatobiliary: No focal liver abnormality is seen.  No gallstones, gallbladder wall thickening, or biliary dilatation. Pancreas: Unremarkable. No pancreatic ductal dilatation or surrounding inflammatory changes. Spleen: Normal in size without focal abnormality. Adrenals/Urinary Tract: Adrenal glands are unremarkable. Kidneys are normal, without renal calculi, focal lesion, or hydronephrosis. Bladder is unremarkable. Stomach/Bowel: Ingested material in the stomach. Prominent stool in the right and transverse colon. Unremarkable small bowel. The appendix is small and not well visualized. No evidence of appendicitis. Vascular/Lymphatic: No significant vascular findings are present. No enlarged abdominal or pelvic lymph nodes. Reproductive: Uterus and bilateral adnexa are unremarkable. Other: Small umbilical hernia containing fat. Musculoskeletal: Minimal anterior spur formation at the L4-5 level. IMPRESSION: 1. No acute abnormality. Specifically, no evidence of appendicitis. 2. Prominent stool in the right and transverse colon. Electronically Signed   By: Claudie Revering M.D.   On: 10/27/2020 11:07    Procedures Procedures (including critical care time)  Medications Ordered in ED Medications  iohexol (OMNIPAQUE) 300 MG/ML solution 100 mL (100 mLs Intravenous Contrast Given 10/27/20 1046)    ED Course  I have reviewed the triage vital signs and the nursing notes.  Pertinent labs & imaging results that were available during my care of  the patient were reviewed by me and considered in my medical decision making (see chart for details).    MDM Rules/Calculators/A&P                         Work-up for the lower abdominal pain without any acute findings.  No leukocytosis.  Pregnancy test negative.  No electrolyte or liver function test abnormalities.  Urinalysis not consistent with urinary tract infection.  Patient denies any discharge.  Will have patient follow-up with her OB/GYN.  Will treat with Naprosyn.  Final Clinical Impression(s) / ED Diagnoses Final diagnoses:  Lower abdominal pain    Rx / DC Orders ED Discharge Orders         Ordered    naproxen (NAPROSYN) 500 MG tablet  2 times daily        10/27/20 1140           Fredia Sorrow, MD 10/27/20 1141

## 2020-12-24 NOTE — L&D Delivery Note (Signed)
Delivery Note Claire Nash is a G2P1001 at [redacted]w[redacted]d who had a spontaneous delivery at 2138 a viable female was delivered via ROA.  APGAR: 9, 9 ; weight 2892g (6lb6oz) .     Admitted for term IOL. Induced with cytotec, AROM. Progressed normally. Received epidural for pain management. Pushed for 7 minutes. Baby was delivered without difficulty. Right leg cord x 1.   Delayed cord clamping for 60 seconds.  Delivery of placenta was spontaneous. Placenta was found to be intact, 3 -vessel cord was noted. The fundus was found to be firm. 1st degree vaginal laceration and right periurethral lacerations were repaired in the normal sterile fashion with 3-0 vicryl.  Estimated blood loss 200cc.Instrument and gauze counts were correct at the end of the procedure.   Placenta status: to L&D for disposal  Anesthesia:  epidural Episiotomy:  none Lacerations:  1st degree vaginal and right periurethral Suture Repair: 3.0 vicryl Est. Blood Loss (mL):  postpartum  Mom to postpartum.  Baby to Couplet care / Skin to Skin.  [name] 10/25/2021, 10:09 PM

## 2021-04-03 ENCOUNTER — Ambulatory Visit (INDEPENDENT_AMBULATORY_CARE_PROVIDER_SITE_OTHER): Payer: Medicaid Other | Admitting: Neurology

## 2021-04-03 ENCOUNTER — Encounter: Payer: Self-pay | Admitting: Neurology

## 2021-04-03 VITALS — BP 127/82 | HR 73 | Ht 66.0 in | Wt 191.5 lb

## 2021-04-03 DIAGNOSIS — G43709 Chronic migraine without aura, not intractable, without status migrainosus: Secondary | ICD-10-CM | POA: Diagnosis not present

## 2021-04-03 MED ORDER — PROMETHAZINE HCL 25 MG PO TABS: 25.0000 mg | ORAL_TABLET | Freq: Four times a day (QID) | ORAL | 1 refills | Status: DC | PRN

## 2021-04-03 NOTE — Progress Notes (Signed)
Chief Complaint  Patient presents with  . New Patient (Initial Visit)    She is currently pregnant with her second child (due in November 2022). Reports having random headaches. She may go weeks without one then have them for days in row. She is using Tylenol which does not really help. She also had an increase in headaches during her first pregnancy. Hx of migraines. She has been noticing some mouth and arm numbness along with her pain.      ASSESSMENT AND PLAN  Claire Nash is a 25 y.o. female    Chronic migraine  She is currently pregnant, first trimester,  I have suggested Tylenol, sleep for migraine abortion,  May consider magnesium oxide 400 mg p.o., riboflavin 100 mg twice a day as migraine prevention  Not a good candidate for triptan,  The other options are Fioricet, for moderate to severe migraine headaches, better to wait after first trimester  Emphasized importance of folic acid supplement   DIAGNOSTIC DATA (LABS, IMAGING, TESTING) - I reviewed patient records, labs, notes, testing and imaging myself where available.  Laboratory evaluations in November 2021, normal CBC, lipase, CMP   HISTORICAL  Claire Nash is a 25 year old female, seen in request by her primary care doctor Carrington Clamp for evaluation of migraine headaches, initial evaluation was on April 03, 2021   I reviewed and summarized the referring note.  She is currently [redacted] weeks pregnant, expected due date will be in November 2022,  She reported headache all her life, very frequent under stressful situation, had a lot of headaches in her high school, she remembered that she was taken to emergency room multiple times for treatment of severe prolonged headaches,  After graduation, but headache has much improved, began to have frequent headache again during her first pregnancy in 2020, her headache is usually retro-orbital area, tends to stay on the left side, severe pounding light noise  sensitivity, nauseous, sometimes intense headache also associated with right arm, right mouth numbness, headache can last four hours or longer, movement make her headache worse, such as bending over or cough with sudden sharp radiating pain behind her eyes  She has to have induction during her first pregnancy at 37 weeks due to preeclampsia, with significant bilateral lower extremity swelling, elevated blood pressure, proteinuria, her symptoms has much improved after delivery  Her headache was much improved after delivery as well, at her baseline she rarely have any significant headaches, usually respond well to sleeping, ibuprofen.  But since March 2022, she find her recent pregnancy, she noticed increased frequency of migraine headaches again, 2-3 times each week, her typical migraine pain, lateralized moderate to severe pounding headaches, with some help with Tylenol, sometimes she has visual distortion, blurry vision before the headaches  Triggers for her migraine are stress, weather change, dehydration,  REVIEW OF SYSTEMS: Full 14 system review of systems performed and notable only for as above All other review of systems were negative.  PHYSICAL EXAM   Vitals:   04/03/21 1112  BP: 127/82  Pulse: 73  Weight: 191 lb 8 oz (86.9 kg)  Height: 5\' 6"  (1.676 m)   Not recorded     Body mass index is 30.91 kg/m.  PHYSICAL EXAMNIATION:  Gen: NAD, conversant, well nourised, well groomed                     Cardiovascular: Regular rate rhythm, no peripheral edema, warm, nontender. Eyes: Conjunctivae clear without exudates or hemorrhage Neck: Supple,  no carotid bruits. Pulmonary: Clear to auscultation bilaterally   NEUROLOGICAL EXAM:  MENTAL STATUS: Speech:    Speech is normal; fluent and spontaneous with normal comprehension.  Cognition:     Orientation to time, place and person     Normal recent and remote memory     Normal Attention span and concentration     Normal Language,  naming, repeating,spontaneous speech     Fund of knowledge   CRANIAL NERVES: CN II: Visual fields are full to confrontation. Pupils are round equal and briskly reactive to light. CN III, IV, VI: extraocular movement are normal. No ptosis. CN V: Facial sensation is intact to light touch CN VII: Face is symmetric with normal eye closure  CN VIII: Hearing is normal to causal conversation. CN IX, X: Phonation is normal. CN XI: Head turning and shoulder shrug are intact  MOTOR: There is no pronator drift of out-stretched arms. Muscle bulk and tone are normal. Muscle strength is normal.  REFLEXES: Reflexes are 2+ and symmetric at the biceps, triceps, knees, and ankles. Plantar responses are flexor.  SENSORY: Intact to light touch, pinprick and vibratory sensation are intact in fingers and toes.  COORDINATION: There is no trunk or limb dysmetria noted.  GAIT/STANCE: Posture is normal. Gait is steady with normal steps, base, arm swing, and turning. Heel and toe walking are normal. Tandem gait is normal.  Romberg is absent.  ALLERGIES: Not on File  HOME MEDICATIONS: Current Outpatient Medications  Medication Sig Dispense Refill  . acetaminophen (TYLENOL) 325 MG tablet Take 650 mg by mouth as needed.     No current facility-administered medications for this visit.    PAST MEDICAL HISTORY: Past Medical History:  Diagnosis Date  . Chronic pelvic pain in female 04/23/2013  . Migraines   . Pre-eclampsia   . Short cervix     PAST SURGICAL HISTORY: Past Surgical History:  Procedure Laterality Date  . URETHRA SURGERY      FAMILY HISTORY: Family History  Problem Relation Age of Onset  . Hypertension Mother   . Fibroids Mother   . Migraines Mother   . Hypertension Father   . Asthma Father   . Hypertension Maternal Grandmother   . Drug abuse Maternal Grandmother   . Drug abuse Maternal Grandfather   . Breast cancer Maternal Aunt     SOCIAL HISTORY: Social History    Socioeconomic History  . Marital status: Single    Spouse name: Not on file  . Number of children: 1  . Years of education: 13  . Highest education level: High school graduate  Occupational History  . Occupation: behavioral health specialist  Tobacco Use  . Smoking status: Never Smoker  . Smokeless tobacco: Never Used  Vaping Use  . Vaping Use: Never used  Substance and Sexual Activity  . Alcohol use: Not Currently  . Drug use: Not Currently    Types: Marijuana    Comment: Last use March 2022.  Marland Kitchen Sexual activity: Not Currently    Birth control/protection: None  Other Topics Concern  . Not on file  Social History Narrative   Updated 04/03/21:   Lives at home with her mother and daughter. She is currently pregnant (expecting in Nov 2022).   Right-handed.   No daily caffeine use.   Social Determinants of Health   Financial Resource Strain: Not on file  Food Insecurity: Not on file  Transportation Needs: Not on file  Physical Activity: Not on file  Stress: Not on  file  Social Connections: Not on file  Intimate Partner Violence: Not on file      Levert Feinstein, M.D. Ph.D.  [facility] [address] [place], [place] [address] Ph: [phone] Fax: [phone]  CC:  Carrington Clamp, MD [address] [place],  [place] [address]  Patient, No Pcp Per (Inactive)

## 2021-04-18 LAB — OB RESULTS CONSOLE RPR: RPR: NONREACTIVE

## 2021-04-18 LAB — OB RESULTS CONSOLE HIV ANTIBODY (ROUTINE TESTING): HIV: NONREACTIVE

## 2021-04-18 LAB — OB RESULTS CONSOLE HEPATITIS B SURFACE ANTIGEN: Hepatitis B Surface Ag: NEGATIVE

## 2021-04-18 LAB — OB RESULTS CONSOLE GC/CHLAMYDIA
Chlamydia: NEGATIVE
Gonorrhea: NEGATIVE

## 2021-04-18 LAB — OB RESULTS CONSOLE ANTIBODY SCREEN: Antibody Screen: NEGATIVE

## 2021-04-18 LAB — OB RESULTS CONSOLE ABO/RH: RH Type: POSITIVE

## 2021-04-18 LAB — HEPATITIS C ANTIBODY: HCV Ab: NEGATIVE

## 2021-04-18 LAB — OB RESULTS CONSOLE RUBELLA ANTIBODY, IGM: Rubella: IMMUNE

## 2021-06-13 ENCOUNTER — Ambulatory Visit: Payer: Medicaid Other | Admitting: Neurology

## 2021-08-02 ENCOUNTER — Other Ambulatory Visit: Payer: Self-pay

## 2021-08-02 ENCOUNTER — Encounter (HOSPITAL_COMMUNITY): Payer: Self-pay

## 2021-08-02 ENCOUNTER — Inpatient Hospital Stay (HOSPITAL_COMMUNITY)
Admission: AD | Admit: 2021-08-02 | Discharge: 2021-08-02 | Disposition: A | Discharge status: Home / Self Care | Payer: Medicaid Other | Attending: Obstetrics and Gynecology | Admitting: Obstetrics and Gynecology

## 2021-08-02 DIAGNOSIS — O98813 Other maternal infectious and parasitic diseases complicating pregnancy, third trimester: Secondary | ICD-10-CM | POA: Diagnosis not present

## 2021-08-02 DIAGNOSIS — N949 Unspecified condition associated with female genital organs and menstrual cycle: Secondary | ICD-10-CM

## 2021-08-02 DIAGNOSIS — O26893 Other specified pregnancy related conditions, third trimester: Secondary | ICD-10-CM

## 2021-08-02 DIAGNOSIS — R109 Unspecified abdominal pain: Secondary | ICD-10-CM

## 2021-08-02 DIAGNOSIS — B373 Candidiasis of vulva and vagina: Secondary | ICD-10-CM

## 2021-08-02 DIAGNOSIS — R102 Pelvic and perineal pain: Secondary | ICD-10-CM | POA: Insufficient documentation

## 2021-08-02 DIAGNOSIS — Z3A28 28 weeks gestation of pregnancy: Secondary | ICD-10-CM | POA: Diagnosis not present

## 2021-08-02 DIAGNOSIS — B3731 Acute candidiasis of vulva and vagina: Secondary | ICD-10-CM

## 2021-08-02 DIAGNOSIS — O368130 Decreased fetal movements, third trimester, not applicable or unspecified: Secondary | ICD-10-CM | POA: Diagnosis present

## 2021-08-02 DIAGNOSIS — O26899 Other specified pregnancy related conditions, unspecified trimester: Secondary | ICD-10-CM

## 2021-08-02 LAB — URINALYSIS, ROUTINE W REFLEX MICROSCOPIC
Bacteria, UA: NONE SEEN
Bilirubin Urine: NEGATIVE
Glucose, UA: NEGATIVE mg/dL
Hgb urine dipstick: NEGATIVE
Ketones, ur: NEGATIVE mg/dL
Leukocytes,Ua: NEGATIVE
Nitrite: NEGATIVE
Protein, ur: 30 mg/dL — AB
Specific Gravity, Urine: 1.019 (ref 1.005–1.030)
pH: 6 (ref 5.0–8.0)

## 2021-08-02 LAB — WET PREP, GENITAL
Clue Cells Wet Prep HPF POC: NONE SEEN
Sperm: NONE SEEN
Trich, Wet Prep: NONE SEEN

## 2021-08-02 MED ORDER — TERCONAZOLE 0.4 % VA CREA: 1.0000 | TOPICAL_CREAM | Freq: Every day | VAGINAL | 0 refills | Status: AC

## 2021-08-02 NOTE — MAU Note (Signed)
Claire Nash is a 25 y.o. at [redacted]w[redacted]d here in MAU reporting: started having abdominal pain yesterday, states it initially started out as a sharp pain but now is more cramping. Also having some white discharge, having some external itching. No bleeding. DFM today, states  has felt some movement but baby is not as active as normal.   Onset of complaint: yesterday  Pain score: 5/10  Vitals:   08/02/21 1243  BP: 137/89  Pulse: 93  Resp: 16  Temp: 97.9 F (36.6 C)  SpO2: 99%     FHT:155  Lab orders placed from triage: UA

## 2021-08-02 NOTE — MAU Provider Note (Signed)
History     CSN: [id number]  Arrival date and time: 08/02/21 1228   Event Date/Time   First Provider Initiated Contact with Patient 08/02/21 1324      Chief Complaint  Patient presents with   Abdominal Pain   Decreased Fetal Movement   Vaginal Discharge   HPI  Ms.Claire Nash is a 25 y.o.female G2P1001 @ [redacted]w[redacted]d here in MAU with abdominal pain and vaginal discharge.  The pain is located in her lower abdomin which started yesterday. She reports worsening pain with turning or changing positions in bed. The pain does not radiate to her back or anywhere else.  Walking and getting up makes it feel like something is pulling.  + fetal movement.   OB History     Gravida  2   Para  1   Term  1   Preterm      AB      Living  1      SAB      IAB      Ectopic      Multiple  0   Live Births  1           Past Medical History:  Diagnosis Date   Chronic pelvic pain in female 04/23/2013   Migraines    Pre-eclampsia    Short cervix     Past Surgical History:  Procedure Laterality Date   URETHRA SURGERY      Family History  Problem Relation Age of Onset   Hypertension Mother    Fibroids Mother    Migraines Mother    Hypertension Father    Asthma Father    Hypertension Maternal Grandmother    Drug abuse Maternal Grandmother    Drug abuse Maternal Grandfather    Breast cancer Maternal Aunt     Social History   Tobacco Use   Smoking status: Never   Smokeless tobacco: Never  Vaping Use   Vaping Use: Never used  Substance Use Topics   Alcohol use: Not Currently   Drug use: Not Currently    Types: Marijuana    Comment: Last use March 2022.    Allergies: Not on File  Medications Prior to Admission  Medication Sig Dispense Refill Last Dose   aspirin 81 MG chewable tablet Chew by mouth daily.   08/01/2021   Prenatal Vit-Fe Fumarate-FA (MULTIVITAMIN-PRENATAL) 27-0.8 MG TABS tablet Take 1 tablet by mouth daily at 12 noon.   08/02/2021   acetaminophen  (TYLENOL) 325 MG tablet Take 650 mg by mouth as needed.      promethazine (PHENERGAN) 25 MG tablet Take 1 tablet (25 mg total) by mouth every 6 (six) hours as needed for nausea or vomiting. 30 tablet 1    Results for orders placed or performed during the hospital encounter of 08/02/21 (from the past 48 hour(s))  Urinalysis, Routine w reflex microscopic Urine, Clean Catch     Status: Abnormal   Collection Time: 08/02/21  1:24 PM  Result Value Ref Range   Color, Urine YELLOW YELLOW   APPearance HAZY (A) CLEAR   Specific Gravity, Urine 1.019 1.005 - 1.030   pH 6.0 5.0 - 8.0   Glucose, UA NEGATIVE NEGATIVE mg/dL   Hgb urine dipstick NEGATIVE NEGATIVE   Bilirubin Urine NEGATIVE NEGATIVE   Ketones, ur NEGATIVE NEGATIVE mg/dL   Protein, ur 30 (A) NEGATIVE mg/dL   Nitrite NEGATIVE NEGATIVE   Leukocytes,Ua NEGATIVE NEGATIVE   RBC / HPF 0-5 0 - 5 RBC/hpf  WBC, UA 0-5 0 - 5 WBC/hpf   Bacteria, UA NONE SEEN NONE SEEN   Squamous Epithelial / LPF 0-5 0 - 5   Mucus PRESENT     Comment: Performed at [facility], [address] [place], [place] [address]  Wet prep, genital     Status: Abnormal   Collection Time: 08/02/21  1:40 PM  Result Value Ref Range   Yeast Wet Prep HPF POC PRESENT (A) NONE SEEN   Trich, Wet Prep NONE SEEN NONE SEEN   Clue Cells Wet Prep HPF POC NONE SEEN NONE SEEN   WBC, Wet Prep HPF POC MANY (A) NONE SEEN   Sperm NONE SEEN     Comment: Performed at [facility], [address], [place], [place] [address]    Review of Systems  Genitourinary:  Negative for dysuria.  Physical Exam   Blood pressure (!) 150/77, pulse 92, temperature 97.9 F (36.6 C), temperature source Oral, resp. rate 17, height 5\' 7"  (1.702 m), weight 101 kg, last menstrual period 10/21/2020, SpO2 100 %, currently breastfeeding.  Physical Exam Constitutional:      General: She is not in acute distress.    Appearance: She is well-developed. She is not ill-appearing or toxic-appearing.   HENT:     Head: Normocephalic.  Abdominal:     Tenderness: There is no abdominal tenderness. There is no guarding or rebound.  Genitourinary:    Comments: Dilation: Closed, thick, posterior. Exam by:: J.Penelopi Mikrut, NP  Neurological:     Mental Status: She is alert and oriented to person, place, and time.  Psychiatric:        Mood and Affect: Mood normal.   Fetal Tracing: Baseline: 140 bpm Variability: Moderate  Accelerations: 10x10 Decelerations: None Toco:  None  MAU Course  Procedures None  MDM  1 elevated BP at the time of DC which was taken and recorded by RN. MAU provider/ NP was not notified of   Assessment and Plan   A:  1. Vaginal yeast infection   2. Round ligament pain   3. Abdominal pain during pregnancy, antepartum   4. [redacted] weeks gestation of pregnancy      P:  Discharge home in stable condition Spoke with Dr. [phone] regarding BP reading at discharge. The office will call her tomorrow to have her come in for a BP check Recommended pregnancy support belt Ok to take warm baths Rx: terazol  Timothy Lasso, NP 08/02/2021 6:44 PM

## 2021-08-03 LAB — GC/CHLAMYDIA PROBE AMP (~~LOC~~) NOT AT ARMC
Chlamydia: NEGATIVE
Comment: NEGATIVE
Comment: NORMAL
Neisseria Gonorrhea: NEGATIVE

## 2021-10-03 LAB — OB RESULTS CONSOLE GBS: GBS: POSITIVE

## 2021-10-25 ENCOUNTER — Inpatient Hospital Stay (HOSPITAL_COMMUNITY): Payer: Medicaid Other | Admitting: Anesthesiology

## 2021-10-25 ENCOUNTER — Inpatient Hospital Stay (HOSPITAL_COMMUNITY): Payer: Medicaid Other

## 2021-10-25 ENCOUNTER — Encounter (HOSPITAL_COMMUNITY): Payer: Self-pay | Admitting: Obstetrics and Gynecology

## 2021-10-25 ENCOUNTER — Inpatient Hospital Stay (HOSPITAL_COMMUNITY)
Admission: AD | Admit: 2021-10-25 | Discharge: 2021-10-27 | DRG: 807 | Disposition: A | Discharge status: Home / Self Care | Payer: Medicaid Other | Attending: Obstetrics and Gynecology | Admitting: Obstetrics and Gynecology

## 2021-10-25 DIAGNOSIS — G43709 Chronic migraine without aura, not intractable, without status migrainosus: Secondary | ICD-10-CM

## 2021-10-25 DIAGNOSIS — Z3A40 40 weeks gestation of pregnancy: Secondary | ICD-10-CM | POA: Diagnosis not present

## 2021-10-25 DIAGNOSIS — O99824 Streptococcus B carrier state complicating childbirth: Secondary | ICD-10-CM | POA: Diagnosis present

## 2021-10-25 DIAGNOSIS — O480 Post-term pregnancy: Secondary | ICD-10-CM | POA: Diagnosis present

## 2021-10-25 DIAGNOSIS — O26893 Other specified pregnancy related conditions, third trimester: Secondary | ICD-10-CM | POA: Diagnosis present

## 2021-10-25 DIAGNOSIS — Z20822 Contact with and (suspected) exposure to covid-19: Secondary | ICD-10-CM | POA: Diagnosis present

## 2021-10-25 DIAGNOSIS — O1493 Unspecified pre-eclampsia, third trimester: Secondary | ICD-10-CM

## 2021-10-25 DIAGNOSIS — Z975 Presence of (intrauterine) contraceptive device: Secondary | ICD-10-CM

## 2021-10-25 DIAGNOSIS — Z148 Genetic carrier of other disease: Secondary | ICD-10-CM

## 2021-10-25 DIAGNOSIS — F1291 Cannabis use, unspecified, in remission: Secondary | ICD-10-CM

## 2021-10-25 LAB — CBC
HCT: 35.7 % — ABNORMAL LOW (ref 36.0–46.0)
Hemoglobin: 11.4 g/dL — ABNORMAL LOW (ref 12.0–15.0)
MCH: 27.3 pg (ref 26.0–34.0)
MCHC: 31.9 g/dL (ref 30.0–36.0)
MCV: 85.4 fL (ref 80.0–100.0)
Platelets: 156 10*3/uL (ref 150–400)
RBC: 4.18 MIL/uL (ref 3.87–5.11)
RDW: 15.1 % (ref 11.5–15.5)
WBC: 10.2 10*3/uL (ref 4.0–10.5)
nRBC: 0 % (ref 0.0–0.2)

## 2021-10-25 LAB — RPR: RPR Ser Ql: NONREACTIVE

## 2021-10-25 LAB — RESP PANEL BY RT-PCR (FLU A&B, COVID) ARPGX2
Influenza A by PCR: NEGATIVE
Influenza B by PCR: NEGATIVE
SARS Coronavirus 2 by RT PCR: NEGATIVE

## 2021-10-25 LAB — TYPE AND SCREEN
ABO/RH(D): O POS
Antibody Screen: NEGATIVE

## 2021-10-25 MED ORDER — LACTATED RINGERS IV SOLN: INTRAVENOUS | Status: DC

## 2021-10-25 MED ORDER — PHENYLEPHRINE 40 MCG/ML (10ML) SYRINGE FOR IV PUSH (FOR BLOOD PRESSURE SUPPORT): 80.0000 ug | PREFILLED_SYRINGE | INTRAVENOUS | Status: DC | PRN

## 2021-10-25 MED ORDER — OXYCODONE-ACETAMINOPHEN 5-325 MG PO TABS: 1.0000 | ORAL_TABLET | ORAL | Status: DC | PRN

## 2021-10-25 MED ORDER — OXYTOCIN-SODIUM CHLORIDE 30-0.9 UT/500ML-% IV SOLN
2.5000 [IU]/h | INTRAVENOUS | Status: DC
  Administered 2021-10-25: 2.5 [IU]/h via INTRAVENOUS
  Filled 2021-10-25: qty 500

## 2021-10-25 MED ORDER — MISOPROSTOL 25 MCG QUARTER TABLET
25.0000 ug | ORAL_TABLET | ORAL | Status: DC | PRN
  Administered 2021-10-25 (×2): 25 ug via VAGINAL
  Filled 2021-10-25 (×5): qty 1

## 2021-10-25 MED ORDER — LACTATED RINGERS IV SOLN: 500.0000 mL | INTRAVENOUS | Status: DC | PRN

## 2021-10-25 MED ORDER — FENTANYL CITRATE (PF) 100 MCG/2ML IJ SOLN: 50.0000 ug | INTRAMUSCULAR | Status: DC | PRN

## 2021-10-25 MED ORDER — LIDOCAINE HCL (PF) 1 % IJ SOLN: 30.0000 mL | INTRAMUSCULAR | Status: DC | PRN

## 2021-10-25 MED ORDER — OXYTOCIN BOLUS FROM INFUSION
333.0000 mL | Freq: Once | INTRAVENOUS | Status: AC
  Administered 2021-10-25: 333 mL via INTRAVENOUS

## 2021-10-25 MED ORDER — OXYCODONE-ACETAMINOPHEN 5-325 MG PO TABS: 2.0000 | ORAL_TABLET | ORAL | Status: DC | PRN

## 2021-10-25 MED ORDER — ACETAMINOPHEN 325 MG PO TABS: 650.0000 mg | ORAL_TABLET | ORAL | Status: DC | PRN

## 2021-10-25 MED ORDER — OXYTOCIN-SODIUM CHLORIDE 30-0.9 UT/500ML-% IV SOLN: 1.0000 m[IU]/min | INTRAVENOUS | Status: DC

## 2021-10-25 MED ORDER — LACTATED RINGERS IV SOLN: 500.0000 mL | Freq: Once | INTRAVENOUS | Status: DC

## 2021-10-25 MED ORDER — PENICILLIN G POT IN DEXTROSE 60000 UNIT/ML IV SOLN
3.0000 10*6.[IU] | INTRAVENOUS | Status: DC
  Administered 2021-10-25 (×2): 3 10*6.[IU] via INTRAVENOUS
  Filled 2021-10-25 (×2): qty 50

## 2021-10-25 MED ORDER — SOD CITRATE-CITRIC ACID 500-334 MG/5ML PO SOLN: 30.0000 mL | ORAL | Status: DC | PRN

## 2021-10-25 MED ORDER — LIDOCAINE HCL (PF) 1 % IJ SOLN
INTRAMUSCULAR | Status: DC | PRN
Start: 2021-10-25 — End: 2021-10-25
  Administered 2021-10-25 (×2): 5 mL via EPIDURAL

## 2021-10-25 MED ORDER — FENTANYL-BUPIVACAINE-NACL 0.5-0.125-0.9 MG/250ML-% EP SOLN
EPIDURAL | Status: AC
  Filled 2021-10-25: qty 250

## 2021-10-25 MED ORDER — DIPHENHYDRAMINE HCL 50 MG/ML IJ SOLN: 12.5000 mg | INTRAMUSCULAR | Status: DC | PRN

## 2021-10-25 MED ORDER — EPHEDRINE 5 MG/ML INJ: 10.0000 mg | INTRAVENOUS | Status: DC | PRN

## 2021-10-25 MED ORDER — ONDANSETRON HCL 4 MG/2ML IJ SOLN: 4.0000 mg | Freq: Four times a day (QID) | INTRAMUSCULAR | Status: DC | PRN

## 2021-10-25 MED ORDER — SODIUM CHLORIDE 0.9 % IV SOLN
5.0000 10*6.[IU] | Freq: Once | INTRAVENOUS | Status: AC
  Administered 2021-10-25: 5 10*6.[IU] via INTRAVENOUS
  Filled 2021-10-25: qty 5

## 2021-10-25 MED ORDER — FENTANYL-BUPIVACAINE-NACL 0.5-0.125-0.9 MG/250ML-% EP SOLN
12.0000 mL/h | EPIDURAL | Status: DC | PRN
  Administered 2021-10-25: 12 mL/h via EPIDURAL

## 2021-10-25 MED ORDER — TERBUTALINE SULFATE 1 MG/ML IJ SOLN: 0.2500 mg | Freq: Once | INTRAMUSCULAR | Status: DC | PRN

## 2021-10-25 NOTE — Progress Notes (Signed)
OB Progress Note  S: Pt feeling mild discomfort with contractions   O: Today's Vitals   10/25/21 0838 10/25/21 1307 10/25/21 1702 10/25/21 1703  BP:  (!) 134/91  130/83  Pulse:  100  89  Resp:  17  17  Temp:  98.2 F (36.8 C)  98.3 F (36.8 C)  TempSrc:  Oral  Oral  Weight:      Height:      PainSc: 0-No pain 0-No pain 4     Body mass index is 40.04 kg/m.  SVE 4/70/-1, AROM thin meconium at 1808  FHR: 145bpm, mod variability, + accels, no decels Toco: ctx q 2-4 mins  A/P:  25Y G2P1001 @ [redacted]w[redacted]d, term IOL Fetal wellbeing: cat I tracing IOL: s/p cytotec x 2, s/p AROM, will start pitocin if contractions space Pain control: epidural upon patient request GBS positive: Penicillin              [name] 10/25/2021, 7:11 PM

## 2021-10-25 NOTE — Lactation Note (Signed)
This note was copied from a baby's chart. Lactation Consultation Note  Patient Name: Claire Nash [id number] Date: 10/25/2021 Reason for consult: L&D Initial assessment;Term Age:25 hours  L&D consult with <60 minutes old infant and P2 mother. Congratulated family on newborn.  Offered assistance with latch, laid back position. Infant latched with ease.   Discussed STS as ideal transition for infants after birth. Talked about primal reflexes. Explained LC services availability during postpartum stay. Thanked family for their time.      Maternal Data Has patient been taught Hand Expression?: No Does the patient have breastfeeding experience prior to this delivery?: Yes How long did the patient breastfeed?: 6 months  Feeding Mother's Current Feeding Choice: Breast Milk  LATCH Score Latch: Grasps breast easily, tongue down, lips flanged, rhythmical sucking.  Audible Swallowing: Spontaneous and intermittent  Type of Nipple: Everted at rest and after stimulation  Comfort (Breast/Nipple): Soft / non-tender  Hold (Positioning): Assistance needed to correctly position infant at breast and maintain latch.  LATCH Score: 9  Interventions Interventions: Breast feeding basics reviewed;Assisted with latch;Hand express;Breast massage;Skin to skin;Education;Expressed milk  Consult Status Consult Status: Follow-up from L&D    Claire Nash 10/25/2021, 10:28 PM

## 2021-10-25 NOTE — Anesthesia Preprocedure Evaluation (Signed)
Anesthesia Evaluation  Patient identified by MRN, date of birth, ID band Patient awake    Reviewed: Allergy & Precautions, H&P , NPO status , Patient's Chart, lab work & pertinent test results  Airway Mallampati: II   Neck ROM: full    Dental   Pulmonary neg pulmonary ROS,    breath sounds clear to auscultation       Cardiovascular hypertension,  Rhythm:regular Rate:Normal     Neuro/Psych  Headaches,    GI/Hepatic   Endo/Other    Renal/GU      Musculoskeletal   Abdominal   Peds  Hematology   Anesthesia Other Findings   Reproductive/Obstetrics (+) Pregnancy                             Anesthesia Physical Anesthesia Plan  ASA: 2  Anesthesia Plan: Epidural   Post-op Pain Management:    Induction: Intravenous  PONV Risk Score and Plan: 2 and Treatment may vary due to age or medical condition  Airway Management Planned: Natural Airway  Additional Equipment:   Intra-op Plan:   Post-operative Plan:   Informed Consent: I have reviewed the patients History and Physical, chart, labs and discussed the procedure including the risks, benefits and alternatives for the proposed anesthesia with the patient or authorized representative who has indicated his/her understanding and acceptance.     Dental advisory given  Plan Discussed with: CRNA, Anesthesiologist and Surgeon  Anesthesia Plan Comments:         Anesthesia Quick Evaluation

## 2021-10-25 NOTE — Anesthesia Procedure Notes (Signed)
Epidural Patient location during procedure: OB Start time: 10/25/2021 7:06 PM End time: 10/25/2021 7:15 PM  Staffing Anesthesiologist: Achille Rich, MD Performed: anesthesiologist   Preanesthetic Checklist Completed: patient identified, IV checked, site marked, risks and benefits discussed, monitors and equipment checked, pre-op evaluation and timeout performed  Epidural Patient position: sitting Prep: DuraPrep Patient monitoring: heart rate, cardiac monitor, continuous pulse ox and blood pressure Approach: midline Location: L2-L3 Injection technique: LOR saline  Needle:  Needle type: Tuohy  Needle gauge: 17 G Needle length: 9 cm Needle insertion depth: 7 cm Catheter type: closed end flexible Catheter size: 19 Gauge Catheter at skin depth: 12 cm Test dose: negative and Other  Assessment Events: blood not aspirated, injection not painful, no injection resistance and negative IV test  Additional Notes Informed consent obtained prior to proceeding including risk of failure, 1% risk of PDPH, risk of minor discomfort and bruising.  Discussed rare but serious complications including epidural abscess, permanent nerve injury, epidural hematoma.  Discussed alternatives to epidural analgesia and patient desires to proceed.  Timeout performed pre-procedure verifying patient name, procedure, and platelet count.  Patient tolerated procedure well. Reason for block:procedure for pain

## 2021-10-25 NOTE — H&P (Addendum)
  Claire Nash is a 25 y.o. female G2P1001 [redacted]w[redacted]d presenting for term IOL. She reports no LOF, VB, Contractions. Normal FM.   Pregnancy c/b: History of preeclampsia in previous pregnancy: has been normotensive in this pregnancy, has been on ASA prophylaxis   OB History     Gravida  2   Para  1   Term  1   Preterm      AB      Living  1      SAB      IAB      Ectopic      Multiple  0   Live Births  1          Past Medical History:  Diagnosis Date   Chronic pelvic pain in female 04/23/2013   Migraines    Pre-eclampsia    Short cervix    Past Surgical History:  Procedure Laterality Date   URETHRA SURGERY     Family History: family history includes Asthma in her father; Breast cancer in her maternal aunt; Drug abuse in her maternal grandfather and maternal grandmother; Fibroids in her mother; Hypertension in her father, maternal grandmother, and mother; Migraines in her mother. Social History:  reports that she has never smoked. She has never used smokeless tobacco. She reports that she does not currently use alcohol. She reports that she does not currently use drugs after having used the following drugs: Marijuana.     Maternal Diabetes: No Genetic Screening: Normal Maternal Ultrasounds/Referrals: Normal Fetal Ultrasounds or other Referrals:  None Maternal Substance Abuse:  No Significant Maternal Medications:  None Significant Maternal Lab Results:  Group B Strep positive Other Comments:  None  Review of Systems Per HPI Exam Physical Exam  Dilation: 1.5 Effacement (%): Thick Station: -3, Ballotable Exam by:: Polos RN Blood pressure 138/86, pulse 93, temperature 97.7 F (36.5 C), temperature source Oral, resp. rate 16, height 5\' 6"  (1.676 m), weight 112.5 kg, last menstrual period 10/21/2020, currently breastfeeding. Gen: NAD, resting comfortably CVS: normal pulses Lungs: nonlabored respirations Abd: Gravid abdomen, Leopolds 7.5#  Fetal testing:  130bpm, moderate variability, + accels, no decels Toco: acontractile  Prenatal labs: ABO, Rh:  --/--/O POS (11/02 [birth year]1974) Antibody: NEG (11/02 03-06-1974) Rubella: Immune (04/26 0000) RPR: Nonreactive (04/26 0000)  HBsAg: Negative (04/26 0000)  HIV: Non-reactive (04/26 0000)  GBS: Positive/-- (10/11 0000)   Assessment/Plan: 25Y G2P1001 @ [redacted]w[redacted]d, term IOL Fetal wellbeing: cat I tracing IOL: s/p cytotec x 1, continue q 4hr PRN cervical ripening. Consider foley/AROM PRN Pain control: epidural upon patient request GBS positive: Penicillin   [redacted]w[redacted]d 10/25/2021, 10:48 AM

## 2021-10-26 LAB — COMPREHENSIVE METABOLIC PANEL
ALT: 15 U/L (ref 0–44)
AST: 17 U/L (ref 15–41)
Albumin: 2.2 g/dL — ABNORMAL LOW (ref 3.5–5.0)
Alkaline Phosphatase: 144 U/L — ABNORMAL HIGH (ref 38–126)
Anion gap: 4 — ABNORMAL LOW (ref 5–15)
BUN: <5 mg/dL — ABNORMAL LOW (ref 6–20)
CO2: 20 mmol/L — ABNORMAL LOW (ref 22–32)
Calcium: 8.8 mg/dL — ABNORMAL LOW (ref 8.9–10.3)
Chloride: 110 mmol/L (ref 98–111)
Creatinine, Ser: 0.63 mg/dL (ref 0.44–1.00)
GFR, Estimated: >60 mL/min (ref >60)
Glucose, Bld: 97 mg/dL (ref 70–99)
Potassium: 3.5 mmol/L (ref 3.5–5.1)
Sodium: 134 mmol/L — ABNORMAL LOW (ref 135–145)
Total Bilirubin: 0.5 mg/dL (ref 0.3–1.2)
Total Protein: 5.2 g/dL — ABNORMAL LOW (ref 6.5–8.1)

## 2021-10-26 LAB — CBC
HCT: 32.8 % — ABNORMAL LOW (ref 36.0–46.0)
Hemoglobin: 10.7 g/dL — ABNORMAL LOW (ref 12.0–15.0)
MCH: 27.7 pg (ref 26.0–34.0)
MCHC: 32.6 g/dL (ref 30.0–36.0)
MCV: 85 fL (ref 80.0–100.0)
Platelets: 152 10*3/uL (ref 150–400)
RBC: 3.86 MIL/uL — ABNORMAL LOW (ref 3.87–5.11)
RDW: 15.4 % (ref 11.5–15.5)
WBC: 10.9 10*3/uL — ABNORMAL HIGH (ref 4.0–10.5)
nRBC: 0 % (ref 0.0–0.2)

## 2021-10-26 MED ORDER — BENZOCAINE-MENTHOL 20-0.5 % EX AERO: 1.0000 "application " | INHALATION_SPRAY | CUTANEOUS | Status: DC | PRN

## 2021-10-26 MED ORDER — OXYCODONE HCL 5 MG PO TABS
5.0000 mg | ORAL_TABLET | ORAL | Status: DC | PRN
  Administered 2021-10-26: 5 mg via ORAL
  Filled 2021-10-26: qty 1

## 2021-10-26 MED ORDER — FLEET ENEMA 7-19 GM/118ML RE ENEM: 1.0000 | ENEMA | Freq: Every day | RECTAL | Status: DC | PRN

## 2021-10-26 MED ORDER — ONDANSETRON HCL 4 MG PO TABS: 4.0000 mg | ORAL_TABLET | ORAL | Status: DC | PRN

## 2021-10-26 MED ORDER — ACETAMINOPHEN 325 MG PO TABS
650.0000 mg | ORAL_TABLET | ORAL | Status: DC | PRN
  Administered 2021-10-26: 650 mg via ORAL
  Filled 2021-10-26: qty 2

## 2021-10-26 MED ORDER — SIMETHICONE 80 MG PO CHEW: 80.0000 mg | CHEWABLE_TABLET | ORAL | Status: DC | PRN

## 2021-10-26 MED ORDER — OXYCODONE HCL 5 MG PO TABS: 10.0000 mg | ORAL_TABLET | ORAL | Status: DC | PRN

## 2021-10-26 MED ORDER — TETANUS-DIPHTH-ACELL PERTUSSIS 5-2.5-18.5 LF-MCG/0.5 IM SUSY: 0.5000 mL | PREFILLED_SYRINGE | Freq: Once | INTRAMUSCULAR | Status: DC

## 2021-10-26 MED ORDER — BISACODYL 10 MG RE SUPP: 10.0000 mg | Freq: Every day | RECTAL | Status: DC | PRN

## 2021-10-26 MED ORDER — IBUPROFEN 600 MG PO TABS
600.0000 mg | ORAL_TABLET | Freq: Four times a day (QID) | ORAL | Status: DC
  Administered 2021-10-26 – 2021-10-27 (×6): 600 mg via ORAL
  Filled 2021-10-26 (×6): qty 1

## 2021-10-26 MED ORDER — DIPHENHYDRAMINE HCL 25 MG PO CAPS: 25.0000 mg | ORAL_CAPSULE | Freq: Four times a day (QID) | ORAL | Status: DC | PRN

## 2021-10-26 MED ORDER — WITCH HAZEL-GLYCERIN EX PADS: 1.0000 "application " | MEDICATED_PAD | CUTANEOUS | Status: DC | PRN

## 2021-10-26 MED ORDER — DIBUCAINE (PERIANAL) 1 % EX OINT: 1.0000 "application " | TOPICAL_OINTMENT | CUTANEOUS | Status: DC | PRN

## 2021-10-26 MED ORDER — PRENATAL MULTIVITAMIN CH
1.0000 | ORAL_TABLET | Freq: Every day | ORAL | Status: DC
  Administered 2021-10-26: 1 via ORAL
  Filled 2021-10-26: qty 1

## 2021-10-26 MED ORDER — DOCUSATE SODIUM 100 MG PO CAPS
100.0000 mg | ORAL_CAPSULE | Freq: Two times a day (BID) | ORAL | Status: DC
  Administered 2021-10-26 (×3): 100 mg via ORAL
  Filled 2021-10-26 (×3): qty 1

## 2021-10-26 MED ORDER — COCONUT OIL OIL: 1.0000 "application " | TOPICAL_OIL | Status: DC | PRN

## 2021-10-26 MED ORDER — ONDANSETRON HCL 4 MG/2ML IJ SOLN: 4.0000 mg | INTRAMUSCULAR | Status: DC | PRN

## 2021-10-26 NOTE — Progress Notes (Signed)
PPD #1 No problems Afeb, VSS, BP normal Fundus firm, NT at U-1 CBC and CMP ok Continue routine postpartum care, baby not ready for circ yet

## 2021-10-26 NOTE — Anesthesia Postprocedure Evaluation (Signed)
Anesthesia Post Note  Patient: Claire Nash  Procedure(s) Performed: AN AD HOC LABOR EPIDURAL     Patient location during evaluation: Mother Baby Anesthesia Type: Epidural Level of consciousness: awake Pain management: satisfactory to patient Vital Signs Assessment: post-procedure vital signs reviewed and stable Respiratory status: spontaneous breathing Cardiovascular status: stable Anesthetic complications: no   No notable events documented.  Last Vitals:  Vitals:   10/26/21 0121 10/26/21 0459  BP: 129/78 123/80  Pulse: (!) 122 (!) 108  Resp: 20 19  Temp: 36.9 C 36.5 C    Last Pain:  Vitals:   10/26/21 0459  TempSrc: Oral  PainSc: 4    Pain Goal:                   [employer]

## 2021-10-26 NOTE — Lactation Note (Signed)
This note was copied from a baby's chart. Lactation Consultation Note  Patient Name: Claire Nash Date: 10/26/2021 Reason for consult: Follow-up assessment;Primapara;1st time breastfeeding;Term;Infant weight loss Age:25 hours LC reviewed the doc flow sheets with mom/ updated / WNL for age.  Baby awake and hungry , wet diaper changed, LC offered to assist to show mom the cross cradle position and baby latched with depth / increased swallows with breast compressions / per mom comfortable/ still feeding at 14 mins.  MBURN Bev Daly aware to doc total time of the feeding. Latch score - 9    Maternal Data Has patient been taught Hand Expression?: Yes  Feeding Mother's Current Feeding Choice: Breast Milk  LATCH Score Latch: Grasps breast easily, tongue down, lips flanged, rhythmical sucking.  Audible Swallowing: Spontaneous and intermittent  Type of Nipple: Everted at rest and after stimulation  Comfort (Breast/Nipple): Soft / non-tender  Hold (Positioning): Assistance needed to correctly position infant at breast and maintain latch.  LATCH Score: 9   Lactation Tools Discussed/Used    Interventions Interventions: Breast feeding basics reviewed;Education;LC Services brochure  Discharge Pump: Personal;DEBP WIC Program: Yes  Consult Status Consult Status: Follow-up Date: 10/26/21    Claire Nash 10/26/2021, 1:27 PM

## 2021-10-27 ENCOUNTER — Inpatient Hospital Stay (HOSPITAL_COMMUNITY)
Admission: AD | Admit: 2021-10-27 | Payer: Medicaid Other | Source: Home / Self Care | Admitting: Obstetrics and Gynecology

## 2021-10-27 ENCOUNTER — Inpatient Hospital Stay (HOSPITAL_COMMUNITY): Payer: Medicaid Other

## 2021-10-27 NOTE — Progress Notes (Signed)
Patient is eating, ambulating, voiding.  Pain control is good.  Vitals:   10/26/21 1440 10/26/21 2128 10/26/21 2228 10/27/21 0612  BP: 128/63 133/78 128/84 116/90  Pulse: 92 (!) 113  96  Resp: 17 20  18   Temp: (!) 97.3 F (36.3 C) 98.7 F (37.1 C)  98.1 F (36.7 C)  TempSrc: Oral Oral  Oral  SpO2: 100%     Weight:      Height:        Fundus firm Perineum without swelling.  Lab Results  Component Value Date   WBC 10.9 (H) 10/26/2021   HGB 10.7 (L) 10/26/2021   HCT 32.8 (L) 10/26/2021   MCV 85.0 10/26/2021   PLT 152 10/26/2021    --/--/O POS (11/02 0828)/RI  A/P Post partum day 2.  Routine care.  Expect d/c today.   Parents desires circumsision.  All risks, benefits and alternatives discussed with the mother.   03-06-1974

## 2021-10-27 NOTE — Lactation Note (Signed)
This note was copied from a baby's chart. Lactation Consultation Note  Patient Name: Boy Tamantha Saline [id number] Date: 10/27/2021 Reason for consult: Primapara;1st time breastfeeding;Term;Infant weight loss;Other (Comment) (3 % weight loss/ post circ / per mom baby last fed at 0814) Age:25 hours Baby asleep after circ.  Mom mentioned she feels baby is breast feeding well / and when nursing 1st breast / leaking on the other. LC reassured mom that is a good sign.  LC reviewed BF D/C teaching.- see below.  Mom aware of the LC BF resources after D/C.  Maternal Data    Feeding Mother's Current Feeding Choice: Breast Milk  LATCH Score                    Lactation Tools Discussed/Used    Interventions Interventions: Breast feeding basics reviewed;Education;LC Services brochure  Discharge Discharge Education: Engorgement and breast care Pump: Manual;DEBP;Personal  Consult Status Consult Status: Complete Date: 10/27/21    Kathrin Greathouse 10/27/2021, 10:37 AM

## 2021-10-27 NOTE — Discharge Summary (Signed)
Postpartum Discharge Summary  Date of Service updated      Patient Name: Claire Nash DOB: 10/23/1996 MRN: [id number]  Date of admission: 10/25/2021 Delivery date:10/25/2021  Delivering provider: Irene Pap E  Date of discharge: 10/27/2021  Admitting diagnosis: [redacted] weeks gestation of pregnancy [Z3A.40] Intrauterine pregnancy: [redacted]w[redacted]d    Secondary diagnosis:  Active Problems:   [redacted] weeks gestation of pregnancy  Additional problems: none    Discharge diagnosis: Term Pregnancy Delivered                                              Post partum procedures: none Augmentation: AROM, Pitocin, and Cytotec Complications: None  Hospital course: Induction of Labor With Vaginal Delivery   25y.o. yo G2P1001 at 434w2das admitted to the hospital 10/25/2021 for induction of labor.  Indication for induction: Postdates.  Patient had an uncomplicated labor course as follows: Membrane Rupture Time/Date: 6:08 PM ,10/25/2021   Delivery Method:Vaginal, Spontaneous  Episiotomy: None  Lacerations:  1st degree;Periurethral  Details of delivery can be found in separate delivery note.  Patient had a routine postpartum course. Patient is discharged home 10/27/21.  Newborn Data: Birth date:10/25/2021  Birth time:9:38 PM  Gender:Female  Living status:Living  Apgars:9 ,9  Weight:2892 g   Magnesium Sulfate received: No BMZ received: No Rhophylac:No MMR:No T-DaP:Given prenatally Flu: No Transfusion:No  Physical exam  Vitals:   10/26/21 1440 10/26/21 2128 10/26/21 2228 10/27/21 0612  BP: 128/63 133/78 128/84 116/90  Pulse: 92 (!) 113  96  Resp: 17 20  18   Temp: (!) 97.3 F (36.3 C) 98.7 F (37.1 C)  98.1 F (36.7 C)  TempSrc: Oral Oral  Oral  SpO2: 100%     Weight:      Height:        Labs: Lab Results  Component Value Date   WBC 10.9 (H) 10/26/2021   HGB 10.7 (L) 10/26/2021   HCT 32.8 (L) 10/26/2021   MCV 85.0 10/26/2021   PLT 152 10/26/2021   CMP Latest Ref Rng & Units  10/26/2021  Glucose 70 - 99 mg/dL 97  BUN 6 - 20 mg/dL <5(L)  Creatinine 0.44 - 1.00 mg/dL 0.63  Sodium 135 - 145 mmol/L 134(L)  Potassium 3.5 - 5.1 mmol/L 3.5  Chloride 98 - 111 mmol/L 110  CO2 22 - 32 mmol/L 20(L)  Calcium 8.9 - 10.3 mg/dL 8.8(L)  Total Protein 6.5 - 8.1 g/dL 5.2(L)  Total Bilirubin 0.3 - 1.2 mg/dL 0.5  Alkaline Phos 38 - 126 U/L 144(H)  AST 15 - 41 U/L 17  ALT 0 - 44 U/L 15   Edinburgh Score: Edinburgh Postnatal Depression Scale Screening Tool 10/27/2021  I have been able to laugh and see the funny side of things. 0  I have looked forward with enjoyment to things. 0  I have blamed myself unnecessarily when things went wrong. 0  I have been anxious or worried for no good reason. 0  I have felt scared or panicky for no good reason. 0  Things have been getting on top of me. 1  I have been so unhappy that I have had difficulty sleeping. 0  I have felt sad or miserable. 0  I have been so unhappy that I have been crying. 0  The thought of harming myself has occurred to me. 0  EdFlavia Shipper  Postnatal Depression Scale Total 1      After visit meds:  Allergies as of 10/27/2021   No Known Allergies      Medication List     STOP taking these medications    aspirin 81 MG chewable tablet       TAKE these medications    acetaminophen 325 MG tablet Commonly known as: TYLENOL Take 650 mg by mouth as needed.   multivitamin-prenatal 27-0.8 MG Tabs tablet Take 1 tablet by mouth daily at 12 noon.   promethazine 25 MG tablet Commonly known as: PHENERGAN Take 1 tablet (25 mg total) by mouth every 6 (six) hours as needed for nausea or vomiting.   terconazole 0.4 % vaginal cream Commonly known as: Terazol 7 Place 1 applicator vaginally at bedtime.               Discharge Care Instructions  (From admission, onward)           Start     Ordered   10/27/21 0000  Discharge wound care:       Comments: Sitz baths and icepacks to perineum.  If stitches,  they will dissolve.   10/27/21 0703             Discharge home in stable condition Infant Feeding:  ? Infant Disposition:home with mother Discharge instruction: per After Visit Summary and Postpartum booklet. Activity: Advance as tolerated. Pelvic rest for 6 weeks.  Diet: routine diet Anticipated Birth Control: Unsure Postpartum Appointment:4 weeks Additional Postpartum F/U:  none Future Appointments:No future appointments. Follow up Visit:  Follow-up Information     Ob/Gyn, [facility]sm[facility] Follow up in 4 week(s).   Contact information: [address] [place] [address] [phone]                     10/27/2021 Daria Pastures, MD

## 2021-11-08 ENCOUNTER — Telehealth (HOSPITAL_COMMUNITY): Payer: Self-pay | Admitting: *Deleted

## 2021-11-08 NOTE — Telephone Encounter (Signed)
Hospital Discharge Follow Up Call:  Patient reports that she is well and has no concerns about her healing process.  EPDS today was 4 and she endorses this accurately reflects that she is doing well emotionally.  Patient says that baby is well and they have a routine f/u with pediatrician tomorrow.  She says that baby sleeps in a bassinet.  Reviewed ABCs of Safe Sleep.

## 2021-11-11 ENCOUNTER — Encounter (HOSPITAL_BASED_OUTPATIENT_CLINIC_OR_DEPARTMENT_OTHER): Payer: Self-pay | Admitting: Urology

## 2021-11-11 ENCOUNTER — Emergency Department (HOSPITAL_BASED_OUTPATIENT_CLINIC_OR_DEPARTMENT_OTHER)
Admission: EM | Admit: 2021-11-11 | Discharge: 2021-11-11 | Disposition: A | Discharge status: Home / Self Care | Payer: Medicaid Other | Attending: Emergency Medicine | Admitting: Emergency Medicine

## 2021-11-11 ENCOUNTER — Emergency Department (HOSPITAL_BASED_OUTPATIENT_CLINIC_OR_DEPARTMENT_OTHER): Payer: Medicaid Other

## 2021-11-11 ENCOUNTER — Other Ambulatory Visit: Payer: Self-pay

## 2021-11-11 DIAGNOSIS — G43909 Migraine, unspecified, not intractable, without status migrainosus: Secondary | ICD-10-CM | POA: Diagnosis not present

## 2021-11-11 DIAGNOSIS — G43009 Migraine without aura, not intractable, without status migrainosus: Secondary | ICD-10-CM

## 2021-11-11 DIAGNOSIS — R519 Headache, unspecified: Secondary | ICD-10-CM | POA: Diagnosis present

## 2021-11-11 LAB — URINALYSIS, ROUTINE W REFLEX MICROSCOPIC
Bilirubin Urine: NEGATIVE
Glucose, UA: NEGATIVE mg/dL
Ketones, ur: NEGATIVE mg/dL
Nitrite: NEGATIVE
Protein, ur: 30 mg/dL — AB
Specific Gravity, Urine: >=1.03 (ref 1.005–1.030)
pH: 6 (ref 5.0–8.0)

## 2021-11-11 LAB — CBC WITH DIFFERENTIAL/PLATELET
Abs Immature Granulocytes: 0 10*3/uL (ref 0.00–0.07)
Basophils Absolute: 0.1 10*3/uL (ref 0.0–0.1)
Basophils Relative: 1 %
Eosinophils Absolute: 0.2 10*3/uL (ref 0.0–0.5)
Eosinophils Relative: 2 %
HCT: 40 % (ref 36.0–46.0)
Hemoglobin: 12.8 g/dL (ref 12.0–15.0)
Lymphocytes Relative: 41 %
Lymphs Abs: 3.9 10*3/uL (ref 0.7–4.0)
MCH: 27.6 pg (ref 26.0–34.0)
MCHC: 32 g/dL (ref 30.0–36.0)
MCV: 86.4 fL (ref 80.0–100.0)
Monocytes Absolute: 1 10*3/uL (ref 0.1–1.0)
Monocytes Relative: 10 %
Neutro Abs: 4.4 10*3/uL (ref 1.7–7.7)
Neutrophils Relative %: 46 %
Platelets: 207 10*3/uL (ref 150–400)
RBC: 4.63 MIL/uL (ref 3.87–5.11)
RDW: 15.9 % — ABNORMAL HIGH (ref 11.5–15.5)
Smear Review: NORMAL
WBC Morphology: >10
WBC: 9.5 10*3/uL (ref 4.0–10.5)
nRBC: 0 % (ref 0.0–0.2)

## 2021-11-11 LAB — COMPREHENSIVE METABOLIC PANEL
ALT: 17 U/L (ref 0–44)
AST: 18 U/L (ref 15–41)
Albumin: 3.4 g/dL — ABNORMAL LOW (ref 3.5–5.0)
Alkaline Phosphatase: 108 U/L (ref 38–126)
Anion gap: 7 (ref 5–15)
BUN: 11 mg/dL (ref 6–20)
CO2: 25 mmol/L (ref 22–32)
Calcium: 8.9 mg/dL (ref 8.9–10.3)
Chloride: 109 mmol/L (ref 98–111)
Creatinine, Ser: 0.82 mg/dL (ref 0.44–1.00)
GFR, Estimated: >60 mL/min (ref >60)
Glucose, Bld: 82 mg/dL (ref 70–99)
Potassium: 3.9 mmol/L (ref 3.5–5.1)
Sodium: 141 mmol/L (ref 135–145)
Total Bilirubin: 0.6 mg/dL (ref 0.3–1.2)
Total Protein: 6.8 g/dL (ref 6.5–8.1)

## 2021-11-11 LAB — URINALYSIS, MICROSCOPIC (REFLEX)

## 2021-11-11 LAB — PREGNANCY, URINE: Preg Test, Ur: NEGATIVE

## 2021-11-11 MED ORDER — ACETAMINOPHEN 500 MG PO TABS
1000.0000 mg | ORAL_TABLET | Freq: Once | ORAL | Status: AC
  Administered 2021-11-11: 1000 mg via ORAL
  Filled 2021-11-11: qty 2

## 2021-11-11 MED ORDER — SODIUM CHLORIDE 0.9 % IV BOLUS
1000.0000 mL | Freq: Once | INTRAVENOUS | Status: AC
  Administered 2021-11-11: 1000 mL via INTRAVENOUS

## 2021-11-11 MED ORDER — DIPHENHYDRAMINE HCL 50 MG/ML IJ SOLN: 12.5000 mg | Freq: Once | INTRAMUSCULAR | Status: DC

## 2021-11-11 MED ORDER — ONDANSETRON HCL 4 MG/2ML IJ SOLN
4.0000 mg | Freq: Once | INTRAMUSCULAR | Status: DC
  Filled 2021-11-11: qty 2

## 2021-11-11 MED ORDER — KETOROLAC TROMETHAMINE 15 MG/ML IJ SOLN: 15.0000 mg | Freq: Once | INTRAMUSCULAR | Status: DC

## 2021-11-11 NOTE — ED Notes (Signed)
Pt is breastfeeding

## 2021-11-11 NOTE — ED Provider Notes (Signed)
[facility] EMERGENCY DEPARTMENT Provider Note   CSN: [id number] Arrival date & time: 11/11/21  1329     History Chief Complaint  Patient presents with   Headache   Emesis    Claire Nash is a 25 y.o. female.  With past medical history of migraine, preeclampsia, recent vaginal delivery on 10/25/2021 who presents emergency department with headache and vomiting.  States that she woke up this morning with "sharp and throbbing 8/10 pain behind her right eye.  She states that this is associated with photophobia, nausea and 3 episodes of vomiting this morning.  She states that she is currently not as nauseated but continues to have pain behind the right eye.  He has a history of migraines but states that this is not the same pattern of headache.  She denies any fevers, IV drug use, lightheadedness or dizziness, syncope, visual disturbances, neck pain or stiffness, recent head trauma.  Of note she was preeclamptic with her previous pregnancy, no preeclampsia or eclampsia with recent vaginal delivery.   Headache Associated symptoms: eye pain, photophobia and vomiting   Associated symptoms: no abdominal pain, no dizziness, no fever, no seizures and no weakness   Emesis Associated symptoms: headaches   Associated symptoms: no abdominal pain and no fever       Past Medical History:  Diagnosis Date   Chronic pelvic pain in female 04/23/2013   Migraines    Pre-eclampsia    Short cervix     Patient Active Problem List   Diagnosis Date Noted   [redacted] weeks gestation of pregnancy 10/25/2021   Chronic migraine w/o aura w/o status migrainosus, not intractable 04/03/2021   Nexplanon in place 02/04/2020   Preeclampsia, third trimester 11/16/2019   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   History of marijuana use 08/14/2019   Back pain 10/13/2013   Migraine with aura 12/05/2010    Past Surgical History:  Procedure Laterality Date   URETHRA SURGERY       OB History     Gravida  2    Para  1   Term  1   Preterm      AB      Living  1      SAB      IAB      Ectopic      Multiple  0   Live Births  1           Family History  Problem Relation Age of Onset   Hypertension Mother    Fibroids Mother    Migraines Mother    Hypertension Father    Asthma Father    Hypertension Maternal Grandmother    Drug abuse Maternal Grandmother    Drug abuse Maternal Grandfather    Breast cancer Maternal Aunt     Social History   Tobacco Use   Smoking status: Never   Smokeless tobacco: Never  Vaping Use   Vaping Use: Never used  Substance Use Topics   Alcohol use: Not Currently   Drug use: Not Currently    Types: Marijuana    Comment: Last use March 2022.    Home Medications Prior to Admission medications   Medication Sig Start Date End Date Taking? Authorizing Provider  acetaminophen (TYLENOL) 325 MG tablet Take 650 mg by mouth as needed.    [provider]  Prenatal Vit-Fe Fumarate-FA (MULTIVITAMIN-PRENATAL) 27-0.8 MG TABS tablet Take 1 tablet by mouth daily at 12 noon.    [provider]  promethazine (  PHENERGAN) 25 MG tablet Take 1 tablet (25 mg total) by mouth every 6 (six) hours as needed for nausea or vomiting. 04/03/21   Levert Feinstein, MD  terconazole (TERAZOL 7) 0.4 % vaginal cream Place 1 applicator vaginally at bedtime. 08/02/21   Rasch, Harolyn Rutherford, NP    Allergies    Patient has no known allergies.  Review of Systems   Review of Systems  Constitutional:  Negative for fever.  Eyes:  Positive for photophobia and pain.  Respiratory:  Negative for shortness of breath.   Cardiovascular:  Negative for chest pain.  Gastrointestinal:  Positive for vomiting. Negative for abdominal pain.  Neurological:  Positive for headaches. Negative for dizziness, seizures, syncope, weakness and light-headedness.  All other systems reviewed and are negative.  Physical Exam Updated Vital Signs BP 129/82 (BP Location: Right Arm)   Pulse 81    Temp 98.2 F (36.8 C) (Oral)   Resp 16   Ht 5\' 6"  (1.676 m)   Wt 112.5 kg   LMP 10/21/2020   SpO2 100%   Breastfeeding Yes   BMI 40.03 kg/m   Physical Exam Vitals and nursing note reviewed.  Constitutional:      General: She is not in acute distress.    Appearance: Normal appearance. She is well-developed. She is not toxic-appearing.  HENT:     Head: Normocephalic and atraumatic.     Mouth/Throat:     Mouth: Mucous membranes are moist.     Pharynx: Oropharynx is clear.  Eyes:     General: No scleral icterus.    Extraocular Movements: Extraocular movements intact.     Pupils: Pupils are equal, round, and reactive to light.  Cardiovascular:     Rate and Rhythm: Normal rate and regular rhythm.     Heart sounds: Normal heart sounds. No murmur heard. Pulmonary:     Effort: Pulmonary effort is normal. No respiratory distress.     Breath sounds: Normal breath sounds.  Abdominal:     General: Bowel sounds are normal. There is no distension.     Palpations: Abdomen is soft.  Musculoskeletal:        General: Normal range of motion.     Cervical back: Normal range of motion and neck supple. No rigidity.  Skin:    General: Skin is warm and dry.     Capillary Refill: Capillary refill takes less than 2 seconds.  Neurological:     General: No focal deficit present.     Mental Status: She is alert and oriented to person, place, and time. Mental status is at baseline.     GCS: GCS eye subscore is 4. GCS verbal subscore is 5. GCS motor subscore is 6.     Cranial Nerves: Cranial nerves 2-12 are intact. No facial asymmetry.     Sensory: Sensation is intact.     Motor: Motor function is intact.     Coordination: Coordination is intact.  Psychiatric:        Mood and Affect: Mood normal.        Speech: Speech normal.        Behavior: Behavior normal.    ED Results / Procedures / Treatments   Labs (all labs ordered are listed, but only abnormal results are displayed) Labs Reviewed   URINALYSIS, ROUTINE W REFLEX MICROSCOPIC - Abnormal; Notable for the following components:      Result Value   APPearance TURBID (*)    Hgb urine dipstick SMALL (*)  Protein, ur 30 (*)    Leukocytes,Ua SMALL (*)    All other components within normal limits  COMPREHENSIVE METABOLIC PANEL - Abnormal; Notable for the following components:   Albumin 3.4 (*)    All other components within normal limits  CBC WITH DIFFERENTIAL/PLATELET - Abnormal; Notable for the following components:   RDW 15.9 (*)    All other components within normal limits  URINALYSIS, MICROSCOPIC (REFLEX) - Abnormal; Notable for the following components:   Bacteria, UA MANY (*)    All other components within normal limits  PREGNANCY, URINE   EKG None  Radiology CT Head Wo Contrast  Result Date: 11/11/2021 CLINICAL DATA:  Altered mental status.  Headache behind Right eye EXAM: CT HEAD WITHOUT CONTRAST TECHNIQUE: Contiguous axial images were obtained from the base of the skull through the vertex without intravenous contrast. COMPARISON:  None. FINDINGS: Brain: No evidence of large-territorial acute infarction. No parenchymal hemorrhage. No mass lesion. No extra-axial collection. No mass effect or midline shift. No hydrocephalus. Basilar cisterns are patent. Vascular: No hyperdense vessel. Skull: No acute fracture or focal lesion. Sinuses/Orbits: Paranasal sinuses and mastoid air cells are clear. The orbits are unremarkable. Other: None. IMPRESSION: No acute intracranial abnormality. Electronically Signed   By: Tish Frederickson M.D.   On: 11/11/2021 17:47    Procedures Procedures   Medications Ordered in ED Medications  ondansetron (ZOFRAN) injection 4 mg (0 mg Intravenous Hold 11/11/21 1622)  sodium chloride 0.9 % bolus 1,000 mL (1,000 mLs Intravenous New Bag/Given 11/11/21 1607)  acetaminophen (TYLENOL) tablet 1,000 mg (1,000 mg Oral Given 11/11/21 1621)   ED Course  I have reviewed the triage vital signs and the  nursing notes.  Pertinent labs & imaging results that were available during my care of the patient were reviewed by me and considered in my medical decision making (see chart for details).  Headache improved with fluids, tylenol, rest.    MDM Rules/Calculators/A&P 25 year old female who presents emergency department with headache.  Likely benign migraine with different pattern from previous.  There is no headache red flags.  Her neurological exam without evidence of meningismus, altered mental status or focal neurological findings.  Presentation is not consistent with an acute bleed.  No trauma.  CT head negative. Physical exam is without evidence of temporal arteritis. No signs of increased intracranial pressure or weight loss. She did have preeclampsia with the pregnancy prior to the most recent.  Per OB/GYN note there was no preeclampsia with recent pregnancy and vaginal delivery at the beginning of November.  Her blood pressure here in the emergency department is not elevated. Given that she is breast-feeding, gave conservative headache cocktail, fluids, Tylenol and Zofran.  She has relief of her symptoms on reevaluation. Struck to return if she has new or worsening headache with vomiting, fever. Safe for discharge  Final Clinical Impression(s) / ED Diagnoses Final diagnoses:  Migraine without aura and without status migrainosus, not intractable    Rx / DC Orders ED Discharge Orders     None        Cristopher Peru, PA-C 11/11/21 1814    Alvira Monday, MD 11/12/21 0800

## 2021-11-11 NOTE — ED Triage Notes (Signed)
Pt reports HA behind right eye, vomiting this morning,  states 2 weeks postpartum.  H/o migraines.

## 2021-11-11 NOTE — Discharge Instructions (Addendum)
He presents emergency department today with a headache.  While you were here we did a work-up which was reassuring.  We gave you some medications to help which seem to help your headache.  We also took a picture of your head because her headache was different than her previous migraines.  This was also normal.  Her blood pressure was also good and not concerning for preeclampsia.  Please return if you have sudden increasing blood pressure.  Or you have new or worsening headache or fever.

## 2022-02-26 ENCOUNTER — Ambulatory Visit: Payer: Medicaid Other | Admitting: Neurology

## 2022-02-27 NOTE — Progress Notes (Deleted)
? ? ?PATIENT: Claire Nash ?DOB: [birth year] ? ?REASON FOR VISIT: Follow up for migraines for migraines ?HISTORY FROM: Patient ?PRIMARY NEUROLOGIST: Dr. Terrace Arabia ? ?HISTORY  ?Claire Nash is a 26 year old female, seen in request by her primary care doctor Carrington Clamp for evaluation of migraine headaches, initial evaluation was on April 03, 2021 ?  ?I reviewed and summarized the referring note. ?  ?She is currently [redacted] weeks pregnant, expected due date will be in November 2022, ?  ?She reported headache all her life, very frequent under stressful situation, had a lot of headaches in her high school, she remembered that she was taken to emergency room multiple times for treatment of severe prolonged headaches, ?  ?After graduation, but headache has much improved, began to have frequent headache again during her first pregnancy in 2020, her headache is usually retro-orbital area, tends to stay on the left side, severe pounding light noise sensitivity, nauseous, sometimes intense headache also associated with right arm, right mouth numbness, headache can last four hours or longer, movement make her headache worse, such as bending over or cough with sudden sharp radiating pain behind her eyes ?  ?She has to have induction during her first pregnancy at 37 weeks due to preeclampsia, with significant bilateral lower extremity swelling, elevated blood pressure, proteinuria, her symptoms has much improved after delivery ?  ?Her headache was much improved after delivery as well, at her baseline she rarely have any significant headaches, usually respond well to sleeping, ibuprofen. ?  ?But since March 2022, she find her recent pregnancy, she noticed increased frequency of migraine headaches again, 2-3 times each week, her typical migraine pain, lateralized moderate to severe pounding headaches, with some help with Tylenol, sometimes she has visual distortion, blurry vision before the headaches ?  ?Triggers for her migraine  are stress, weather change, dehydration, ? ?Update February 28, 2022 SS:  ? ?REVIEW OF SYSTEMS: Out of a complete 14 system review of symptoms, the patient complains only of the following symptoms, and all other reviewed systems are negative. ? ?ALLERGIES: ?No Known Allergies ? ?HOME MEDICATIONS: ?Outpatient Medications Prior to Visit  ?Medication Sig Dispense Refill  ? acetaminophen (TYLENOL) 325 MG tablet Take 650 mg by mouth as needed.    ? Prenatal Vit-Fe Fumarate-FA (MULTIVITAMIN-PRENATAL) 27-0.8 MG TABS tablet Take 1 tablet by mouth daily at 12 noon.    ? promethazine (PHENERGAN) 25 MG tablet Take 1 tablet (25 mg total) by mouth every 6 (six) hours as needed for nausea or vomiting. 30 tablet 1  ? terconazole (TERAZOL 7) 0.4 % vaginal cream Place 1 applicator vaginally at bedtime. 45 g 0  ? ?No facility-administered medications prior to visit.  ? ? ?PAST MEDICAL HISTORY: ?Past Medical History:  ?Diagnosis Date  ? Chronic pelvic pain in female 04/23/2013  ? Migraines   ? Pre-eclampsia   ? Short cervix   ? ? ?PAST SURGICAL HISTORY: ?Past Surgical History:  ?Procedure Laterality Date  ? URETHRA SURGERY    ? ? ?FAMILY HISTORY: ?Family History  ?Problem Relation Age of Onset  ? Hypertension Mother   ? Fibroids Mother   ? Migraines Mother   ? Hypertension Father   ? Asthma Father   ? Hypertension Maternal Grandmother   ? Drug abuse Maternal Grandmother   ? Drug abuse Maternal Grandfather   ? Breast cancer Maternal Aunt   ? ? ?SOCIAL HISTORY: ?Social History  ? ?Socioeconomic History  ? Marital status: Single  ?  Spouse name: Not on  file  ? Number of children: 1  ? Years of education: 50  ? Highest education level: High school graduate  ?Occupational History  ? Occupation: behavioral health specialist  ?Tobacco Use  ? Smoking status: Never  ? Smokeless tobacco: Never  ?Vaping Use  ? Vaping Use: Never used  ?Substance and Sexual Activity  ? Alcohol use: Not Currently  ? Drug use: Not Currently  ?  Types: Marijuana  ?   Comment: Last use March 2022.  ? Sexual activity: Not Currently  ?  Birth control/protection: None  ?Other Topics Concern  ? Not on file  ?Social History Narrative  ? Updated 04/03/21:  ? Lives at home with a friend and her children with her daughter. She is currently pregnant (expecting in Nov 2022).  ? Right-handed.  ? No daily caffeine use.  ? ?Social Determinants of Health  ? ?Financial Resource Strain: Not on file  ?Food Insecurity: Not on file  ?Transportation Needs: Not on file  ?Physical Activity: Not on file  ?Stress: Not on file  ?Social Connections: Not on file  ?Intimate Partner Violence: Not on file  ? ? ? ? ?PHYSICAL EXAM ? ?There were no vitals filed for this visit. ?There is no height or weight on file to calculate BMI. ? ?Generalized: Well developed, in no acute distress  ? ?Neurological examination  ?Mentation: Alert oriented to time, place, history taking. Follows all commands speech and language fluent ?Cranial nerve II-XII: Pupils were equal round reactive to light. Extraocular movements were full, visual field were full on confrontational test. Facial sensation and strength were normal. Uvula tongue midline. Head turning and shoulder shrug  were normal and symmetric. ?Motor: The motor testing reveals 5 over 5 strength of all 4 extremities. Good symmetric motor tone is noted throughout.  ?Sensory: Sensory testing is intact to soft touch on all 4 extremities. No evidence of extinction is noted.  ?Coordination: Cerebellar testing reveals good finger-nose-finger and heel-to-shin bilaterally.  ?Gait and station: Gait is normal. Tandem gait is normal. Romberg is negative. No drift is seen.  ?Reflexes: Deep tendon reflexes are symmetric and normal bilaterally.  ? ?DIAGNOSTIC DATA (LABS, IMAGING, TESTING) ?- I reviewed patient records, labs, notes, testing and imaging myself where available. ? ?Lab Results  ?Component Value Date  ? WBC 9.5 11/11/2021  ? HGB 12.8 11/11/2021  ? HCT 40.0 11/11/2021  ? MCV  86.4 11/11/2021  ? PLT 207 11/11/2021  ? ?   ?Component Value Date/Time  ? NA 141 11/11/2021 1603  ? NA 138 01/21/2020 1151  ? K 3.9 11/11/2021 1603  ? CL 109 11/11/2021 1603  ? CO2 25 11/11/2021 1603  ? GLUCOSE 82 11/11/2021 1603  ? BUN 11 11/11/2021 1603  ? BUN 5 (L) 01/21/2020 1151  ? CREATININE 0.82 11/11/2021 1603  ? CALCIUM 8.9 11/11/2021 1603  ? PROT 6.8 11/11/2021 1603  ? PROT 6.3 01/21/2020 1151  ? ALBUMIN 3.4 (L) 11/11/2021 1603  ? ALBUMIN 3.4 (L) 01/21/2020 1151  ? AST 18 11/11/2021 1603  ? ALT 17 11/11/2021 1603  ? ALKPHOS 108 11/11/2021 1603  ? BILITOT 0.6 11/11/2021 1603  ? BILITOT <0.2 01/21/2020 1151  ? GFRNONAA >60 11/11/2021 1603  ? GFRAA >60 02/02/2020 1857  ? ?No results found for: CHOL, HDL, LDLCALC, LDLDIRECT, TRIG, CHOLHDL ?No results found for: HGBA1C ?No results found for: VITAMINB12 ?No results found for: TSH ? ? ? ?ASSESSMENT AND PLAN ?26 y.o. year old female  ? ?1.  Chronic migraine headache ? ? ? ?  Margie Ege, AGNP-C, DNP 02/27/2022, 9:26 PM ?[facility] ?[address], [address] ?[place], [place] [address] ?[phone] ? ? ?

## 2022-02-28 ENCOUNTER — Encounter: Payer: Self-pay | Admitting: Neurology

## 2022-02-28 ENCOUNTER — Ambulatory Visit: Payer: Medicaid Other | Admitting: Neurology

## 2022-06-15 ENCOUNTER — Encounter (HOSPITAL_BASED_OUTPATIENT_CLINIC_OR_DEPARTMENT_OTHER): Payer: Self-pay

## 2022-06-15 ENCOUNTER — Emergency Department (HOSPITAL_BASED_OUTPATIENT_CLINIC_OR_DEPARTMENT_OTHER)
Admission: EM | Admit: 2022-06-15 | Discharge: 2022-06-15 | Disposition: A | Discharge status: Home / Self Care | Payer: Medicaid Other | Attending: Emergency Medicine | Admitting: Emergency Medicine

## 2022-06-15 ENCOUNTER — Other Ambulatory Visit: Payer: Self-pay

## 2022-06-15 DIAGNOSIS — R519 Headache, unspecified: Secondary | ICD-10-CM | POA: Diagnosis not present

## 2022-06-15 DIAGNOSIS — R112 Nausea with vomiting, unspecified: Secondary | ICD-10-CM | POA: Diagnosis not present

## 2022-06-15 DIAGNOSIS — R197 Diarrhea, unspecified: Secondary | ICD-10-CM | POA: Diagnosis not present

## 2022-06-15 LAB — COMPREHENSIVE METABOLIC PANEL
ALT: 15 U/L (ref 0–44)
AST: 16 U/L (ref 15–41)
Albumin: 3.7 g/dL (ref 3.5–5.0)
Alkaline Phosphatase: 90 U/L (ref 38–126)
Anion gap: 5 (ref 5–15)
BUN: 10 mg/dL (ref 6–20)
CO2: 24 mmol/L (ref 22–32)
Calcium: 8.9 mg/dL (ref 8.9–10.3)
Chloride: 107 mmol/L (ref 98–111)
Creatinine, Ser: 0.75 mg/dL (ref 0.44–1.00)
GFR, Estimated: >60 mL/min (ref >60)
Glucose, Bld: 106 mg/dL — ABNORMAL HIGH (ref 70–99)
Potassium: 3.5 mmol/L (ref 3.5–5.1)
Sodium: 136 mmol/L (ref 135–145)
Total Bilirubin: 0.5 mg/dL (ref 0.3–1.2)
Total Protein: 7.3 g/dL (ref 6.5–8.1)

## 2022-06-15 LAB — CBC WITH DIFFERENTIAL/PLATELET
Abs Immature Granulocytes: 0.03 10*3/uL (ref 0.00–0.07)
Basophils Absolute: 0 10*3/uL (ref 0.0–0.1)
Basophils Relative: 0 %
Eosinophils Absolute: 0 10*3/uL (ref 0.0–0.5)
Eosinophils Relative: 0 %
HCT: 37.6 % (ref 36.0–46.0)
Hemoglobin: 12.6 g/dL (ref 12.0–15.0)
Immature Granulocytes: 0 %
Lymphocytes Relative: 14 %
Lymphs Abs: 1.5 10*3/uL (ref 0.7–4.0)
MCH: 29.8 pg (ref 26.0–34.0)
MCHC: 33.5 g/dL (ref 30.0–36.0)
MCV: 88.9 fL (ref 80.0–100.0)
Monocytes Absolute: 0.4 10*3/uL (ref 0.1–1.0)
Monocytes Relative: 4 %
Neutro Abs: 8.3 10*3/uL — ABNORMAL HIGH (ref 1.7–7.7)
Neutrophils Relative %: 82 %
Platelets: 192 10*3/uL (ref 150–400)
RBC: 4.23 MIL/uL (ref 3.87–5.11)
RDW: 13.2 % (ref 11.5–15.5)
WBC: 10.3 10*3/uL (ref 4.0–10.5)
nRBC: 0 % (ref 0.0–0.2)

## 2022-06-15 LAB — HCG, SERUM, QUALITATIVE: Preg, Serum: NEGATIVE

## 2022-06-15 LAB — LIPASE, BLOOD: Lipase: 26 U/L (ref 11–51)

## 2022-06-15 MED ORDER — DEXAMETHASONE 4 MG PO TABS
10.0000 mg | ORAL_TABLET | Freq: Once | ORAL | Status: AC
  Administered 2022-06-15: 10 mg via ORAL
  Filled 2022-06-15: qty 3

## 2022-06-15 MED ORDER — PROCHLORPERAZINE EDISYLATE 10 MG/2ML IJ SOLN
10.0000 mg | Freq: Once | INTRAMUSCULAR | Status: AC
  Administered 2022-06-15: 10 mg via INTRAVENOUS
  Filled 2022-06-15: qty 2

## 2022-06-15 MED ORDER — SODIUM CHLORIDE 0.9 % IV BOLUS
1000.0000 mL | Freq: Once | INTRAVENOUS | Status: AC
  Administered 2022-06-15: 1000 mL via INTRAVENOUS

## 2022-06-15 MED ORDER — LOPERAMIDE HCL 2 MG PO CAPS
4.0000 mg | ORAL_CAPSULE | Freq: Once | ORAL | Status: AC
  Administered 2022-06-15: 4 mg via ORAL
  Filled 2022-06-15: qty 2

## 2022-06-15 MED ORDER — DIPHENHYDRAMINE HCL 50 MG/ML IJ SOLN
25.0000 mg | Freq: Once | INTRAMUSCULAR | Status: AC
  Administered 2022-06-15: 25 mg via INTRAVENOUS
  Filled 2022-06-15: qty 1

## 2022-06-15 MED ORDER — PROMETHAZINE HCL 25 MG PO TABS: 25.0000 mg | ORAL_TABLET | Freq: Four times a day (QID) | ORAL | 0 refills | Status: AC | PRN

## 2022-06-15 NOTE — ED Provider Notes (Signed)
[facility] EMERGENCY DEPARTMENT Provider Note   CSN: [id number] Arrival date & time: 06/15/22  1138     History  Chief Complaint  Patient presents with   Migraine   Emesis    Claire Nash is a 26 y.o. female.  26 yo F with a chief complaints of a headache.  This is right-sided.  It started this morning when she woke up.  She has been having nausea vomiting and diarrhea.  She has a history of headaches and this feels somewhat similar but does not typically have vomiting or diarrhea with it.  She ate at cookout last night.  No known sick contacts.  No recent travel.  No fevers.   Migraine  Emesis      Home Medications Prior to Admission medications   Medication Sig Start Date End Date Taking? Authorizing Provider  acetaminophen (TYLENOL) 325 MG tablet Take 650 mg by mouth as needed.    [provider]  Prenatal Vit-Fe Fumarate-FA (MULTIVITAMIN-PRENATAL) 27-0.8 MG TABS tablet Take 1 tablet by mouth daily at 12 noon.    [provider]  promethazine (PHENERGAN) 25 MG tablet Take 1 tablet (25 mg total) by mouth every 6 (six) hours as needed for nausea or vomiting. 06/15/22   Melene Plan, DO  terconazole (TERAZOL 7) 0.4 % vaginal cream Place 1 applicator vaginally at bedtime. 08/02/21   Rasch, Harolyn Rutherford, NP      Allergies    Patient has no known allergies.    Review of Systems   Review of Systems  Gastrointestinal:  Positive for vomiting.    Physical Exam Updated Vital Signs BP 135/79   Pulse 69   Temp 98 F (36.7 C) (Oral)   Resp 16   Ht 5\' 2"  (1.575 m)   Wt 112.5 kg   LMP 06/02/2022   SpO2 99%   BMI 45.36 kg/m  Physical Exam Vitals and nursing note reviewed.  Constitutional:      General: She is not in acute distress.    Appearance: She is well-developed. She is not diaphoretic.  HENT:     Head: Normocephalic and atraumatic.  Eyes:     Pupils: Pupils are equal, round, and reactive to light.  Cardiovascular:     Rate and  Rhythm: Normal rate and regular rhythm.     Heart sounds: No murmur heard.    No friction rub. No gallop.  Pulmonary:     Effort: Pulmonary effort is normal.     Breath sounds: No wheezing or rales.  Abdominal:     General: There is no distension.     Palpations: Abdomen is soft.     Tenderness: There is no abdominal tenderness.  Musculoskeletal:        General: No tenderness.     Cervical back: Normal range of motion and neck supple.  Skin:    General: Skin is warm and dry.  Neurological:     Mental Status: She is alert and oriented to person, place, and time.     Cranial Nerves: Cranial nerves 2-12 are intact.     Sensory: Sensation is intact.     Motor: Motor function is intact.     Coordination: Coordination is intact.  Psychiatric:        Behavior: Behavior normal.     ED Results / Procedures / Treatments   Labs (all labs ordered are listed, but only abnormal results are displayed) Labs Reviewed  CBC WITH DIFFERENTIAL/PLATELET - Abnormal; Notable for the  following components:      Result Value   Neutro Abs 8.3 (*)    All other components within normal limits  COMPREHENSIVE METABOLIC PANEL - Abnormal; Notable for the following components:   Glucose, Bld 106 (*)    All other components within normal limits  LIPASE, BLOOD  HCG, SERUM, QUALITATIVE    EKG None  Radiology No results found.  Procedures Procedures    Medications Ordered in ED Medications  dexamethasone (DECADRON) tablet 10 mg (has no administration in time range)  sodium chloride 0.9 % bolus 1,000 mL (1,000 mLs Intravenous New Bag/Given 06/15/22 1221)  prochlorperazine (COMPAZINE) injection 10 mg (10 mg Intravenous Given 06/15/22 1222)  diphenhydrAMINE (BENADRYL) injection 25 mg (25 mg Intravenous Given 06/15/22 1222)  loperamide (IMODIUM) capsule 4 mg (4 mg Oral Given 06/15/22 1221)    ED Course/ Medical Decision Making/ A&P                           Medical Decision Making Amount and/or  Complexity of Data Reviewed Labs: ordered.  Risk Prescription drug management.   26 yO F with a chief complaint of a headache.  This been going on since this morning.  Has a history of headaches and this feels similar however does not typically get vomiting and diarrhea with that.  Could be a gastrointestinal illness coinciding with her headache.  We will treat vomiting and diarrhea bolus of IV fluids lab work reassess.  Lab work without significant electrolyte abnormality no anemia.  Pregnancy test is negative.  Patient feeling quite a bit better on reassessment.  Will discharge home.  1:21 PM:  I have discussed the diagnosis/risks/treatment options with the patient and family.  Evaluation and diagnostic testing in the emergency department does not suggest an emergent condition requiring admission or immediate intervention beyond what has been performed at this time.  They will follow up with  PCP, neuro. We also discussed returning to the ED immediately if new or worsening sx occur. We discussed the sx which are most concerning (e.g., sudden worsening pain, fever, inability to tolerate by mouth) that necessitate immediate return. Medications administered to the patient during their visit and any new prescriptions provided to the patient are listed below.  Medications given during this visit Medications  dexamethasone (DECADRON) tablet 10 mg (has no administration in time range)  sodium chloride 0.9 % bolus 1,000 mL (1,000 mLs Intravenous New Bag/Given 06/15/22 1221)  prochlorperazine (COMPAZINE) injection 10 mg (10 mg Intravenous Given 06/15/22 1222)  diphenhydrAMINE (BENADRYL) injection 25 mg (25 mg Intravenous Given 06/15/22 1222)  loperamide (IMODIUM) capsule 4 mg (4 mg Oral Given 06/15/22 1221)     The patient appears reasonably screen and/or stabilized for discharge and I doubt any other medical condition or other [facility] requiring further screening, evaluation, or treatment in the ED at this  time prior to discharge.          Final Clinical Impression(s) / ED Diagnoses Final diagnoses:  Nausea vomiting and diarrhea  Right-sided headache    Rx / DC Orders ED Discharge Orders          Ordered    promethazine (PHENERGAN) 25 MG tablet  Every 6 hours PRN        06/15/22 1320              Melene Plan, DO 06/15/22 1321

## 2022-06-27 NOTE — Progress Notes (Unsigned)
Patient: Claire Nash Date of Birth: August 01, 1996  Reason for Visit: Follow up History from: Patient Primary Neurologist: Terrace Arabia  ASSESSMENT AND PLAN 26 y.o. year old female   1.  Chronic migraine headache -Severe migraines only a few times a year, will hold off on preventative, she usually has to go to the ER -Currently breast-feeding, but is planning to cease -Try Maxalt 10 mg as needed for acute headache, may combine with Phenergan or Aleve for severe headache; if she does take Maxalt while breast-feeding recommend pumping and discarding for the next feed  (Per [facility]act [facility]: Breastmilk levels of rizatriptan are low and the half-life in milk is relatively short. Amounts ingested by the infant are small and unlikely to affect the nursing infant) -Follow-up in 6 months virtually, reach out for any worsening or medication adjustment -CT head November 2022 was unremarkable  HISTORY  Claire Nash is a 26 year old female, seen in request by her primary care doctor Carrington Clamp for evaluation of migraine headaches, initial evaluation was on April 03, 2021   I reviewed and summarized the referring note.   She is currently [redacted] weeks pregnant, expected due date will be in November 2022,   She reported headache all her life, very frequent under stressful situation, had a lot of headaches in her high school, she remembered that she was taken to emergency room multiple times for treatment of severe prolonged headaches,   After graduation, but headache has much improved, began to have frequent headache again during her first pregnancy in 2020, her headache is usually retro-orbital area, tends to stay on the left side, severe pounding light noise sensitivity, nauseous, sometimes intense headache also associated with right arm, right mouth numbness, headache can last four hours or longer, movement make her headache worse, such as bending over or cough with sudden sharp radiating pain behind her  eyes   She has to have induction during her first pregnancy at 37 weeks due to preeclampsia, with significant bilateral lower extremity swelling, elevated blood pressure, proteinuria, her symptoms has much improved after delivery   Her headache was much improved after delivery as well, at her baseline she rarely have any significant headaches, usually respond well to sleeping, ibuprofen.   But since March 2022, she find her recent pregnancy, she noticed increased frequency of migraine headaches again, 2-3 times each week, her typical migraine pain, lateralized moderate to severe pounding headaches, with some help with Tylenol, sometimes she has visual distortion, blurry vision before the headaches   Triggers for her migraine are stress, weather change, dehydration,  Update 06/28/22 SS: Has 35 month old at home. She is breastfeeding. Migraines vary, not predictable. Went to ER 06/15/22 for migraine, got IV fluids, decadron, compazine, benadryl, got relief. This year only 1 severe migraine. Has nausea, vomiting with migraines. Tries to take Tylenol and Ibuprofen.   REVIEW OF SYSTEMS: Out of a complete 14 system review of symptoms, the patient complains only of the following symptoms, and all other reviewed systems are negative.  See HPI  ALLERGIES: No Known Allergies  HOME MEDICATIONS: Outpatient Medications Prior to Visit  Medication Sig Dispense Refill   acetaminophen (TYLENOL) 325 MG tablet Take 650 mg by mouth as needed. (Patient not taking: Reported on 06/28/2022)     Prenatal Vit-Fe Fumarate-FA (MULTIVITAMIN-PRENATAL) 27-0.8 MG TABS tablet Take 1 tablet by mouth daily at 12 noon. (Patient not taking: Reported on 06/28/2022)     promethazine (PHENERGAN) 25 MG tablet Take 1 tablet (25 mg total)  by mouth every 6 (six) hours as needed for nausea or vomiting. (Patient not taking: Reported on 06/28/2022) 30 tablet 0   terconazole (TERAZOL 7) 0.4 % vaginal cream Place 1 applicator vaginally at bedtime.  (Patient not taking: Reported on 06/28/2022) 45 g 0   No facility-administered medications prior to visit.    PAST MEDICAL HISTORY: Past Medical History:  Diagnosis Date   Chronic pelvic pain in female 04/23/2013   Migraines    Pre-eclampsia    Short cervix     PAST SURGICAL HISTORY: Past Surgical History:  Procedure Laterality Date   URETHRA SURGERY      FAMILY HISTORY: Family History  Problem Relation Age of Onset   Hypertension Mother    Fibroids Mother    Migraines Mother    Hypertension Father    Asthma Father    Hypertension Maternal Grandmother    Drug abuse Maternal Grandmother    Drug abuse Maternal Grandfather    Breast cancer Maternal Aunt     SOCIAL HISTORY: Social History   Socioeconomic History   Marital status: Single    Spouse name: Not on file   Number of children: 1   Years of education: 12   Highest education level: High school graduate  Occupational History   Occupation: behavioral health specialist  Tobacco Use   Smoking status: Never   Smokeless tobacco: Never  Vaping Use   Vaping Use: Never used  Substance and Sexual Activity   Alcohol use: Not Currently   Drug use: Not Currently    Types: Marijuana    Comment: Last use March 2022.   Sexual activity: Not Currently    Birth control/protection: None  Other Topics Concern   Not on file  Social History Narrative   Updated 04/03/21:   Lives at home with a friend and her children with her daughter. She is currently pregnant (expecting in Nov 2022).   Right-handed.   No daily caffeine use.   Social Determinants of Health   Financial Resource Strain: Not on file  Food Insecurity: Not on file  Transportation Needs: Not on file  Physical Activity: Not on file  Stress: Not on file  Social Connections: Not on file  Intimate Partner Violence: Not on file    PHYSICAL EXAM  Vitals:   06/28/22 1027  BP: 129/86  Weight: 222 lb 2 oz (100.8 kg)  Height: 5\' 7"  (1.702 m)   Body mass  index is 34.79 kg/m.  Generalized: Well developed, in no acute distress  Neurological examination  Mentation: Alert oriented to time, place, history taking. Follows all commands speech and language fluent Cranial nerve II-XII: Pupils were equal round reactive to light. Extraocular movements were full, visual field were full on confrontational test. Facial sensation and strength were normal. Head turning and shoulder shrug  were normal and symmetric. Motor: The motor testing reveals 5 over 5 strength of all 4 extremities. Good symmetric motor tone is noted throughout.  Sensory: Sensory testing is intact to soft touch on all 4 extremities. No evidence of extinction is noted.  Coordination: Cerebellar testing reveals good finger-nose-finger and heel-to-shin bilaterally.  Gait and station: Gait is normal.   Reflexes: Deep tendon reflexes are symmetric and normal bilaterally.   DIAGNOSTIC DATA (LABS, IMAGING, TESTING) - I reviewed patient records, labs, notes, testing and imaging myself where available.  Lab Results  Component Value Date   WBC 10.3 06/15/2022   HGB 12.6 06/15/2022   HCT 37.6 06/15/2022   MCV 88.9  06/15/2022   PLT 192 06/15/2022      Component Value Date/Time   NA 136 06/15/2022 1214   NA 138 01/21/2020 1151   K 3.5 06/15/2022 1214   CL 107 06/15/2022 1214   CO2 24 06/15/2022 1214   GLUCOSE 106 (H) 06/15/2022 1214   BUN 10 06/15/2022 1214   BUN 5 (L) 01/21/2020 1151   CREATININE 0.75 06/15/2022 1214   CALCIUM 8.9 06/15/2022 1214   PROT 7.3 06/15/2022 1214   PROT 6.3 01/21/2020 1151   ALBUMIN 3.7 06/15/2022 1214   ALBUMIN 3.4 (L) 01/21/2020 1151   AST 16 06/15/2022 1214   ALT 15 06/15/2022 1214   ALKPHOS 90 06/15/2022 1214   BILITOT 0.5 06/15/2022 1214   BILITOT <0.2 01/21/2020 1151   GFRNONAA >60 06/15/2022 1214   GFRAA >60 02/02/2020 1857   No results found for: "CHOL", "HDL", "LDLCALC", "LDLDIRECT", "TRIG", "CHOLHDL" No results found for: "HGBA1C" No  results found for: "VITAMINB12" No results found for: "TSH"  Margie Ege, AGNP-C, DNP 06/28/2022, 11:16 AM [facility] [address], [address] [place], [place] [address] [phone]

## 2022-06-28 ENCOUNTER — Ambulatory Visit (INDEPENDENT_AMBULATORY_CARE_PROVIDER_SITE_OTHER): Payer: Medicaid Other | Admitting: Neurology

## 2022-06-28 VITALS — BP 129/86 | Ht 67.0 in | Wt 222.1 lb

## 2022-06-28 DIAGNOSIS — G43709 Chronic migraine without aura, not intractable, without status migrainosus: Secondary | ICD-10-CM

## 2022-06-28 MED ORDER — RIZATRIPTAN BENZOATE 10 MG PO TBDP: 10.0000 mg | ORAL_TABLET | ORAL | 3 refills | Status: AC | PRN

## 2022-06-28 NOTE — Patient Instructions (Signed)
For acute headache, try Maxalt at onset, can combine with phenergan, even Aleve or Tylenol  If you use Maxalt, pump at your next feed and discharge, however   Drug Levels and Effects Summary of Use during Lactation  Breastmilk levels of rizatriptan are low and the half-life in milk is relatively short. Amounts ingested by the infant are small and unlikely to affect the nursing infant. Drug Levels  Maternal Levels. Five women who were at least 1 month postpartum and used rizatriptan to treat migraine provided one milk sample before their dose, then additional milk samples at 1, 2, 4, 8, 12 and 24 hours after their 10 mg oral dose. The average peak milk level was 58.4 mcg/L (range 14.6 to 105.6 mcg/L) and occurred 2 hours after the dose in 4 women ad 4 hours after the dose in another. The average milk level was 9 mcg/L (range 2.6 to 14.8 mcg/L) and the average half-life in milk was 2.2 hours (range 1.6 to 3.1 hours). The average daily infant dosage of rizatriptan was 1.3 mcg/kg (range 0.4 to 2.2 mcg/kg) and the weight-adjusted infant dosage averaged 0.9% (range 0.5 to 1.4%) of the maternal dose.[1]  Infant Levels. Relevant published information was not found as of the revision date. Effects in Breastfed Infants  Relevant published information was not found as of the revision date. Effects on Lactation and Breastmilk  Relevant published information was not found as of the revision date. Alternate Drugs to Consider  Eletriptan, Sumatriptan, Zolmitriptan

## 2022-11-24 ENCOUNTER — Emergency Department (HOSPITAL_BASED_OUTPATIENT_CLINIC_OR_DEPARTMENT_OTHER)
Admission: EM | Admit: 2022-11-24 | Discharge: 2022-11-24 | Disposition: A | Discharge status: Home / Self Care | Payer: Medicaid Other | Attending: Emergency Medicine | Admitting: Emergency Medicine

## 2022-11-24 ENCOUNTER — Encounter (HOSPITAL_BASED_OUTPATIENT_CLINIC_OR_DEPARTMENT_OTHER): Payer: Self-pay

## 2022-11-24 ENCOUNTER — Other Ambulatory Visit: Payer: Self-pay

## 2022-11-24 DIAGNOSIS — M25561 Pain in right knee: Secondary | ICD-10-CM | POA: Diagnosis not present

## 2022-11-24 DIAGNOSIS — M25562 Pain in left knee: Secondary | ICD-10-CM | POA: Diagnosis not present

## 2022-11-24 NOTE — ED Provider Notes (Signed)
[facility] EMERGENCY DEPARTMENT Provider Note   CSN: [id number] Arrival date & time: 11/24/22  1237     History  Chief Complaint  Patient presents with   Knee Pain    Claire Nash is a 26 y.o. female presenting with atraumatic knee pain.  She reports that she originally started to have pain in her left knee a couple of weeks ago but now is also having pain in her right knee.  Says that it is worse in the morning and it is difficult to get out of bed or ambulate with her children.  She says up until a month ago she was working from home and now she has started to work at a [employer] which may contribute to her pain.  She says that she has not tried any medications because it is not severe enough to require medical management.  No numbness or tingling.  No reported falls.   Knee Pain      Home Medications Prior to Admission medications   Medication Sig Start Date End Date Taking? Authorizing Provider  acetaminophen (TYLENOL) 325 MG tablet Take 650 mg by mouth as needed. Patient not taking: Reported on 06/28/2022    [provider]  Prenatal Vit-Fe Fumarate-FA (MULTIVITAMIN-PRENATAL) 27-0.8 MG TABS tablet Take 1 tablet by mouth daily at 12 noon. Patient not taking: Reported on 06/28/2022    [provider]  promethazine (PHENERGAN) 25 MG tablet Take 1 tablet (25 mg total) by mouth every 6 (six) hours as needed for nausea or vomiting. Patient not taking: Reported on 06/28/2022 06/15/22   Melene Plan, DO  rizatriptan (MAXALT-MLT) 10 MG disintegrating tablet Take 1 tablet (10 mg total) by mouth as needed for migraine. May repeat in 2 hours if needed 06/28/22   Glean Salvo, NP  terconazole (TERAZOL 7) 0.4 % vaginal cream Place 1 applicator vaginally at bedtime. Patient not taking: Reported on 06/28/2022 08/02/21   Rasch, Harolyn Rutherford, NP      Allergies    Patient has no known allergies.    Review of Systems   Review of Systems  Physical Exam Updated  Vital Signs BP 117/76   Pulse 81   Temp 98 F (36.7 C) (Oral)   Resp 18   Ht 5\' 6"  (1.676 m)   Wt 98.9 kg   SpO2 98%   BMI 35.19 kg/m  Physical Exam Vitals and nursing note reviewed.  Constitutional:      Appearance: Normal appearance.  HENT:     Head: Normocephalic and atraumatic.  Eyes:     General: No scleral icterus.    Conjunctiva/sclera: Conjunctivae normal.  Pulmonary:     Effort: Pulmonary effort is normal. No respiratory distress.  Musculoskeletal:     Comments: Full range of motion of bilateral knees.  No step-offs or crepitus.  No point tenderness.  Negative anterior/posterior drawer.  No abnormalities with valgus/varus testing.  Strong DP pulse and full range of motion of the ankles.  No tenderness to the hip.  Skin:    Findings: No rash.  Neurological:     Mental Status: She is alert.  Psychiatric:        Mood and Affect: Mood normal.     ED Results / Procedures / Treatments   Labs (all labs ordered are listed, but only abnormal results are displayed) Labs Reviewed - No data to display  EKG None  Radiology No results found.  Procedures Procedures   Medications Ordered in ED  Medications - No data to display  ED Course/ Medical Decision Making/ A&P                           Medical Decision Making  26 year old female presenting with atraumatic knee pain.  Differential includes but is not limited to bursitis, septic arthritis, osteo-/rheumatoid arthritis, fracture, effusion.  This is not exhaustive.  Physical exam relatively benign.  No provokable tenderness.  MDM/disposition: I suspect patient is having some degree of arthritis, potentially autoimmune illness such as rheumatoid arthritis.  She says that her mother has a history of this as well.  She is neurovascularly intact.  Has not tried any medications.  At this time I believe she does not have an emergent condition requiring intervention today.  No signs of septic arthritis, cellulitis or  large effusion.  She is agreeable to discharge with sports medicine follow-up.  She also says she does not have a PCP so I have referred her to a primary care provider who can ultimately refer her to rheumatology as they see fit.  She is agreeable and ambulated out of the department.   Final Clinical Impression(s) / ED Diagnoses Final diagnoses:  Acute pain of both knees    Rx / DC Orders ED Discharge Orders     None      Results and diagnoses were explained to the patient. Return precautions discussed in full. Patient had no additional questions and expressed complete understanding.   This chart was dictated using voice recognition software.  Despite best efforts to proofread,  errors can occur which can change the documentation meaning.    Woodroe Chen 11/24/22 1550    Alvira Monday, MD 11/27/22 2042

## 2022-11-24 NOTE — Discharge Instructions (Signed)
You came to the emergency department today due to knee pain.  It is suspicious for arthritis however I would like you to follow-up with your primary care doctor and orthopedics.  I have attached multiple offices to these papers for you to try and get established with.  There is a dual action arthritis medication that has acetaminophen and ibuprofen which will be a good option for your swelling and discomfort.  You may also just use ibuprofen or acetaminophen separately.  Return to the emergency department with any severe swelling of either leg, difficulty breathing, loss of sensation of your leg or inability to walk.  Do not hesitate to return with any other concerning symptoms.  It was a pleasure to meet you and we hope you feel better!

## 2022-11-24 NOTE — ED Triage Notes (Signed)
Pt beginning in left knee 1 week ago with swelling. Pt reports elevating it and resting and that the swelling went away afterwards. Pt reports starting new job 3 weeks ago that requires her to be on her feet more. Swelling has now started coming back in left knee per patient and is in right knee as well (mild swelling noted) . No pain on palpation, pt reports pain with movement/bending. Denies fever, no redness.

## 2022-11-26 ENCOUNTER — Ambulatory Visit: Payer: Self-pay

## 2022-11-26 ENCOUNTER — Ambulatory Visit (INDEPENDENT_AMBULATORY_CARE_PROVIDER_SITE_OTHER): Payer: Medicaid Other | Admitting: Family Medicine

## 2022-11-26 ENCOUNTER — Encounter: Payer: Self-pay | Admitting: Family Medicine

## 2022-11-26 VITALS — BP 124/70 | Ht 66.0 in | Wt 218.0 lb

## 2022-11-26 DIAGNOSIS — M25462 Effusion, left knee: Secondary | ICD-10-CM

## 2022-11-26 DIAGNOSIS — M25562 Pain in left knee: Secondary | ICD-10-CM

## 2022-11-26 MED ORDER — MELOXICAM 15 MG PO TABS: 15.0000 mg | ORAL_TABLET | Freq: Every day | ORAL | 1 refills | Status: AC | PRN

## 2022-11-26 NOTE — Progress Notes (Signed)
  Claire Nash - 26 y.o. female MRN [id number]  Date of birth: [birth year]  SUBJECTIVE:  Including CC & ROS.  No chief complaint on file.   Claire Nash is a 26 y.o. female that is presenting with acute left knee pain.  Reports the pain is more anterior in nature.  She is having trouble with her flexion and extension.  Denies any injury inciting event.  She has been walking more than normal.  Review of the emergency department note from 12/2 shows she is counseled supportive care   Review of Systems See HPI   HISTORY: Past Medical, Surgical, Social, and Family History Reviewed & Updated per EMR.   Pertinent Historical Findings include:  Past Medical History:  Diagnosis Date   Chronic pelvic pain in female 04/23/2013   Migraines    Pre-eclampsia    Short cervix     Past Surgical History:  Procedure Laterality Date   URETHRA SURGERY       PHYSICAL EXAM:  VS: BP 124/70   Ht 5\' 6"  (1.676 m)   Wt 218 lb (98.9 kg)   BMI 35.19 kg/m  Physical Exam Gen: NAD, alert, cooperative with exam, well-appearing MSK:  Neurovascularly intact    Limited ultrasound: Left knee pain:  Mild effusion suprapatellar pouch. Normal-appearing quadricep and patellar tendon. Mild degenerative changes of the medial meniscus. No significant changes in the lateral compartment. Mild Baker's cyst  Summary: Findings consistent with effusion  Ultrasound and interpretation by , MD    ASSESSMENT & PLAN:   Knee effusion, left Acutely occurring.  No signs of inflammation and more likely associated with meniscus. -Counseled on home exercise therapy and supportive care. -Meloxicam -Green sport insoles -Could consider custom orthotics or physical therapy

## 2022-11-26 NOTE — Assessment & Plan Note (Signed)
Acutely occurring.  No signs of inflammation and more likely associated with meniscus. -Counseled on home exercise therapy and supportive care. -Meloxicam -Green sport insoles -Could consider custom orthotics or physical therapy

## 2022-11-26 NOTE — Patient Instructions (Signed)
Nice to meet you Please use ice as needed  Please try the insoles  Please try the exercises   Please send me a message in MyChart with any questions or updates.  Please see me back in 4-6 weeks.   --Dr. Jordan Likes

## 2022-12-27 ENCOUNTER — Ambulatory Visit: Payer: Medicaid Other | Admitting: Family Medicine

## 2023-01-01 ENCOUNTER — Ambulatory Visit: Payer: Medicaid Other | Admitting: Family Medicine

## 2023-01-09 ENCOUNTER — Telehealth: Payer: Medicaid Other | Admitting: Neurology

## 2023-01-09 NOTE — Progress Notes (Deleted)
Virtual Visit via Video Note  I connected with Claire Nash on 01/09/23 at  2:00 PM EST by a video enabled telemedicine application and verified that I am speaking with the correct person using two identifiers.  Location: Patient: *** Provider: ***   I discussed the limitations of evaluation and management by telemedicine and the availability of in person appointments. The patient expressed understanding and agreed to proceed.  History of Present Illness: Claire Nash is a 27 year old female, seen in request by her primary care doctor Claire Nash for evaluation of migraine headaches, initial evaluation was on April 03, 2021   I reviewed and summarized the referring note.   She is currently [redacted] weeks pregnant, expected due date will be in November 2022,   She reported headache all her life, very frequent under stressful situation, had a lot of headaches in her high school, she remembered that she was taken to emergency room multiple times for treatment of severe prolonged headaches,   After graduation, but headache has much improved, began to have frequent headache again during her first pregnancy in 2020, her headache is usually retro-orbital area, tends to stay on the left side, severe pounding light noise sensitivity, nauseous, sometimes intense headache also associated with right arm, right mouth numbness, headache can last four hours or longer, movement make her headache worse, such as bending over or cough with sudden sharp radiating pain behind her eyes   She has to have induction during her first pregnancy at 37 weeks due to preeclampsia, with significant bilateral lower extremity swelling, elevated blood pressure, proteinuria, her symptoms has much improved after delivery   Her headache was much improved after delivery as well, at her baseline she rarely have any significant headaches, usually respond well to sleeping, ibuprofen.   But since March 2022, she find her recent  pregnancy, she noticed increased frequency of migraine headaches again, 2-3 times each week, her typical migraine pain, lateralized moderate to severe pounding headaches, with some help with Tylenol, sometimes she has visual distortion, blurry vision before the headaches   Triggers for her migraine are stress, weather change, dehydration,   Update 06/28/22 SS: Has 60 month old at home. She is breastfeeding. Migraines vary, not predictable. Went to ER 06/15/22 for migraine, got IV fluids, decadron, compazine, benadryl, got relief. This year only 1 severe migraine. Has nausea, vomiting with migraines. Tries to take Tylenol and Ibuprofen.   Update January 09, 2023 SS:    Observations/Objective:   Assessment and Plan:   Follow Up Instructions:    I discussed the assessment and treatment plan with the patient. The patient was provided an opportunity to ask questions and all were answered. The patient agreed with the plan and demonstrated an understanding of the instructions.   The patient was advised to call back or seek an in-person evaluation if the symptoms worsen or if the condition fails to improve as anticipated.  I provided *** minutes of non-face-to-face time during this encounter.   Claire Cloud, NP

## 2023-04-08 ENCOUNTER — Encounter: Payer: Self-pay | Admitting: *Deleted

## 2024-10-28 ENCOUNTER — Ambulatory Visit: Admission: EM | Admit: 2024-10-28 | Discharge: 2024-10-28 | Disposition: A | Discharge status: Home / Self Care

## 2024-10-28 ENCOUNTER — Encounter: Payer: Self-pay | Admitting: Emergency Medicine

## 2024-10-28 DIAGNOSIS — J209 Acute bronchitis, unspecified: Secondary | ICD-10-CM | POA: Diagnosis not present

## 2024-10-28 MED ORDER — BENZONATATE 100 MG PO CAPS: 100.0000 mg | ORAL_CAPSULE | Freq: Three times a day (TID) | ORAL | 0 refills | Status: AC

## 2024-10-28 MED ORDER — PREDNISONE 50 MG PO TABS: ORAL_TABLET | ORAL | 0 refills | Status: AC

## 2024-10-28 MED ORDER — GUAIFENESIN ER 600 MG PO TB12: 600.0000 mg | ORAL_TABLET | Freq: Two times a day (BID) | ORAL | 0 refills | Status: AC

## 2024-10-28 NOTE — ED Provider Notes (Signed)
 [facility]    CSN: [id number] Arrival date & time: 10/28/24  0831      History   Chief Complaint Chief Complaint  Patient presents with   Chest Pain    HPI Claire Nash is a 28 y.o. female.   Patient presents today due to persistent cough recovering from a viral illness.  Patient states that her cough has been productive of yellow and green sputum but has since changed clear.  Patient states that she is experiencing some chest tightness and is unable to lay flat in bed right now because she is coughing all night.  Patient denies use of medications for symptoms.  Patient denies fever, chills or change in appetite  The history is provided by the patient.  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 Authorizing Provider  benzonatate (TESSALON) 100 MG capsule Take 1 capsule (100 mg total) by mouth every 8 (eight) hours. 10/28/24  Yes Andra Corean BROCKS, PA-C  guaiFENesin (MUCINEX) 600 MG 12 hr tablet Take 1 tablet (600 mg total) by mouth 2 (two) times daily for 10 days. 10/28/24 11/07/24 Yes Andra Corean C, PA-C  predniSONE (DELTASONE) 50 MG tablet 1 tab daily for 5 days. 10/28/24  Yes Andra Corean BROCKS, PA-C  acetaminophen  (TYLENOL ) 325 MG tablet Take 650 mg by mouth as needed. Patient not taking: Reported on 06/28/2022    [provider]  meloxicam  (MOBIC ) 15 MG tablet Take 1 tablet (15 mg total) by mouth daily as needed. Patient not taking: Reported on 10/28/2024 11/26/22   Chick Venetia BRAVO, MD  Prenatal Vit-Fe Fumarate-FA (MULTIVITAMIN-PRENATAL) 27-0.8 MG TABS tablet Take 1 tablet by mouth daily at 12 noon. Patient not taking: Reported on 06/28/2022    [provider]  promethazine  (PHENERGAN ) 25 MG tablet Take 1 tablet (25 mg total) by mouth every 6 (six) hours as needed for nausea or vomiting. Patient not taking: Reported on 06/28/2022 06/15/22   Emil Share, DO  rizatriptan  (MAXALT -MLT) 10 MG disintegrating tablet Take 1 tablet (10 mg total) by mouth as needed for migraine. May repeat in 2 hours if needed Patient not taking: Reported on 10/28/2024 06/28/22   Gayland Lauraine PARAS, NP  terconazole  (TERAZOL 7 ) 0.4 % vaginal cream Place 1 applicator vaginally  at bedtime. Patient not taking: Reported on 06/28/2022 08/02/21   Rasch, Delon FERNS, NP    Family History Family History  Problem Relation Age of Onset   Hypertension Mother    Fibroids Mother    Migraines Mother    Hypertension Father    Asthma Father    Hypertension Maternal Grandmother    Drug abuse Maternal Grandmother    Drug abuse Maternal Grandfather    Breast cancer Maternal Aunt     Social History Social History   Tobacco Use   Smoking status: Never   Smokeless tobacco: Never  Vaping Use   Vaping status: Never Used  Substance Use Topics   Alcohol use: Not Currently   Drug use: Yes    Frequency: 2.0 times per week    Types: Marijuana     Allergies   Patient has no known allergies.   Review of Systems Review of Systems  Cardiovascular:  Positive for chest pain.     Physical Exam Triage  Vital Signs ED Triage Vitals  Encounter Vitals Group     BP 10/28/24 0854 121/81     Girls Systolic BP Percentile --      Girls Diastolic BP Percentile --      Boys Systolic BP Percentile --      Boys Diastolic BP Percentile --      Pulse Rate 10/28/24 0854 67     Resp 10/28/24 0854 14     Temp 10/28/24 0854 98.3 F (36.8 C)     Temp Source 10/28/24 0854 Oral     SpO2 10/28/24 0854 98 %     Weight --      Height --      Head Circumference --      Peak Flow --      Pain Score 10/28/24 0855 0     Pain Loc --      Pain Education --      Exclude from Growth Chart --    No data found.  Updated Vital Signs BP 121/81 (BP Location: Left Arm)   Pulse 67   Temp 98.3 F (36.8 C) (Oral)   Resp 14   LMP 10/16/2024 (Exact Date)   SpO2 98%   Breastfeeding No   Visual Acuity Right Eye Distance:   Left Eye Distance:   Bilateral Distance:    Right Eye Near:   Left Eye Near:    Bilateral Near:     Physical Exam Vitals and nursing note reviewed.  Constitutional:      General: She is not in acute distress.    Appearance: Normal appearance. She is not ill-appearing, toxic-appearing or diaphoretic.  Eyes:     General: No scleral icterus. Cardiovascular:     Rate and Rhythm: Normal rate and regular rhythm.     Heart sounds: Normal heart sounds.  Pulmonary:     Effort: Pulmonary effort is normal. No respiratory distress.     Breath sounds: Normal breath sounds. No wheezing or rhonchi.  Skin:    General: Skin is warm.  Neurological:     Mental Status: She is alert and oriented to person, place, and time.  Psychiatric:        Mood and Affect: Mood normal.        Behavior: Behavior normal.      UC Treatments / Results  Labs (all labs ordered are listed, but only abnormal results are displayed) Labs Reviewed - No data to display  EKG   Radiology  No results found.  Procedures Procedures (including critical care time)  Medications Ordered in UC Medications - No  data to display  Initial Impression / Assessment and Plan / UC Course  I have reviewed the triage vital signs and the nursing notes.  Pertinent labs & imaging results that were available during my care of the patient were reviewed by me and considered in my medical decision making (see chart for details).    Final Clinical Impressions(s) / UC Diagnoses   Final diagnoses:  Acute bronchitis, unspecified organism   Discharge Instructions   None    ED Prescriptions     Medication Sig Dispense Auth. Provider   benzonatate (TESSALON) 100 MG capsule Take 1 capsule (100 mg total) by mouth every 8 (eight) hours. 30 capsule Andra Krabbe C, PA-C   guaiFENesin (MUCINEX) 600 MG 12 hr tablet Take 1 tablet (600 mg total) by mouth 2 (two) times daily for 10 days. 20 tablet Andra Krabbe C, PA-C   predniSONE (DELTASONE) 50 MG tablet 1 tab daily for 5 days. 5 tablet Andra Krabbe BROCKS, PA-C      PDMP not reviewed this encounter.   Andra Krabbe BROCKS, PA-C 10/28/24 [id number]

## 2024-10-28 NOTE — ED Triage Notes (Signed)
 Pt reports chest pain that started on Monday night (11/3) and has remained constant. Describes pressure that sits high on mid-chest just below her neck. Denies dizziness, lightheadedness, and palpitations. Reports SOB with exertion. Denies cardiac hx but reports high bp readings at times, no bp meds hx. Reports last week she was hoarse with productive cough that has improved, but is still present (mild). Took excedrin yesterday for a headache. No other med use for symptoms.

## 2024-11-01 ENCOUNTER — Encounter (HOSPITAL_BASED_OUTPATIENT_CLINIC_OR_DEPARTMENT_OTHER): Payer: Self-pay | Admitting: Emergency Medicine

## 2024-11-01 ENCOUNTER — Emergency Department (HOSPITAL_BASED_OUTPATIENT_CLINIC_OR_DEPARTMENT_OTHER)
Admission: EM | Admit: 2024-11-01 | Discharge: 2024-11-01 | Disposition: A | Discharge status: Home / Self Care | Attending: Emergency Medicine | Admitting: Emergency Medicine

## 2024-11-01 ENCOUNTER — Other Ambulatory Visit: Payer: Self-pay

## 2024-11-01 ENCOUNTER — Emergency Department (HOSPITAL_BASED_OUTPATIENT_CLINIC_OR_DEPARTMENT_OTHER)

## 2024-11-01 DIAGNOSIS — R0789 Other chest pain: Secondary | ICD-10-CM | POA: Diagnosis present

## 2024-11-01 DIAGNOSIS — R079 Chest pain, unspecified: Secondary | ICD-10-CM

## 2024-11-01 LAB — COMPREHENSIVE METABOLIC PANEL WITH GFR
ALT: 21 U/L (ref 0–44)
AST: 23 U/L (ref 15–41)
Albumin: 4.4 g/dL (ref 3.5–5.0)
Alkaline Phosphatase: 72 U/L (ref 38–126)
Anion gap: 12 (ref 5–15)
BUN: 14 mg/dL (ref 6–20)
CO2: 21 mmol/L — ABNORMAL LOW (ref 22–32)
Calcium: 9.6 mg/dL (ref 8.9–10.3)
Chloride: 104 mmol/L (ref 98–111)
Creatinine, Ser: 1.02 mg/dL — ABNORMAL HIGH (ref 0.44–1.00)
GFR, Estimated: >60 mL/min (ref >60)
Glucose, Bld: 116 mg/dL — ABNORMAL HIGH (ref 70–99)
Potassium: 4.5 mmol/L (ref 3.5–5.1)
Sodium: 137 mmol/L (ref 135–145)
Total Bilirubin: 0.4 mg/dL (ref 0.0–1.2)
Total Protein: 7.6 g/dL (ref 6.5–8.1)

## 2024-11-01 LAB — CBC WITH DIFFERENTIAL/PLATELET
Abs Immature Granulocytes: 0.04 K/uL (ref 0.00–0.07)
Basophils Absolute: 0 K/uL (ref 0.0–0.1)
Basophils Relative: 0 %
Eosinophils Absolute: 0 K/uL (ref 0.0–0.5)
Eosinophils Relative: 0 %
HCT: 38.8 % (ref 36.0–46.0)
Hemoglobin: 12.9 g/dL (ref 12.0–15.0)
Immature Granulocytes: 0 %
Lymphocytes Relative: 13 %
Lymphs Abs: 1.4 K/uL (ref 0.7–4.0)
MCH: 29.3 pg (ref 26.0–34.0)
MCHC: 33.2 g/dL (ref 30.0–36.0)
MCV: 88 fL (ref 80.0–100.0)
Monocytes Absolute: 0.4 K/uL (ref 0.1–1.0)
Monocytes Relative: 3 %
Neutro Abs: 9.4 K/uL — ABNORMAL HIGH (ref 1.7–7.7)
Neutrophils Relative %: 84 %
Platelets: 154 K/uL (ref 150–400)
RBC: 4.41 MIL/uL (ref 3.87–5.11)
RDW: 14 % (ref 11.5–15.5)
WBC: 11.3 K/uL — ABNORMAL HIGH (ref 4.0–10.5)
nRBC: 0 % (ref 0.0–0.2)

## 2024-11-01 LAB — HCG, SERUM, QUALITATIVE: Preg, Serum: NEGATIVE

## 2024-11-01 LAB — LIPASE, BLOOD: Lipase: 24 U/L (ref 11–51)

## 2024-11-01 LAB — TROPONIN T, HIGH SENSITIVITY: Troponin T High Sensitivity: <15 ng/L (ref 0–19)

## 2024-11-01 MED ORDER — ALUM & MAG HYDROXIDE-SIMETH 200-200-20 MG/5ML PO SUSP
30.0000 mL | Freq: Once | ORAL | Status: AC
  Administered 2024-11-01: 30 mL via ORAL
  Filled 2024-11-01: qty 30

## 2024-11-01 NOTE — Discharge Instructions (Signed)
 Please call your family doctor tomorrow to set up an appointment.  Please return for worsening symptoms especially if they occur with exercise or if you cough up blood or feel you are going to pass out.  Try pepcid or tagamet up to twice a day.  Try to avoid things that may make this worse, most commonly these are spicy foods tomato based products fatty foods chocolate and peppermint.  Alcohol and tobacco can also make this worse.  Return to the emergency department for sudden worsening pain fever or inability to eat or drink.

## 2024-11-01 NOTE — ED Triage Notes (Signed)
 Pt dx with bronchitis earlier this week; sts she is not improving with medications; feels like someone is sitting on my chest; NAD in triage, amb w/o difficulty

## 2024-11-01 NOTE — ED Provider Notes (Signed)
 [facility] Provider Note   CSN: [id number] Arrival date & time: 11/01/24  2044     Patient presents with: Chest Pain   Claire Nash is a 28 y.o. female.   28 yo F with a chief complaint of chest pain.  Patient says she feels like there is a pressure mostly to the upper portion of her chest.  Perhaps it is worse when she is up and moving around.  Recently getting over an illness where she was coughing.  Seen by urgent care and thought to be bronchitis.  Patient felt her symptoms were worsening.  No longer coughing.  No fevers.  She denies history of PE or DVT denies hemoptysis denies unilateral lower extremity edema denies recent surgery or immobilization.  Denies history of cancer denies estrogen use.     Chest Pain      Prior to Admission medications   Medication Sig Start Date End Date Taking? Authorizing Provider  acetaminophen  (TYLENOL ) 325 MG tablet Take 650 mg by mouth as needed. Patient not taking: Reported on 06/28/2022    [provider]  benzonatate (TESSALON) 100 MG capsule Take 1 capsule (100 mg total) by mouth every 8 (eight) hours. 10/28/24   Peterson, Stephanie C, PA-C  guaiFENesin (MUCINEX) 600 MG 12 hr tablet Take 1 tablet (600 mg total) by mouth 2 (two) times daily for 10 days. 10/28/24 11/07/24  Andra Corean BROCKS, PA-C  meloxicam  (MOBIC ) 15 MG tablet Take 1 tablet (15 mg total) by mouth daily as needed. Patient not taking: Reported on 10/28/2024 11/26/22   Chick Venetia BRAVO, MD  predniSONE (DELTASONE) 50 MG tablet 1 tab daily for 5 days. 10/28/24   Andra Corean BROCKS, PA-C  Prenatal Vit-Fe Fumarate-FA (MULTIVITAMIN-PRENATAL) 27-0.8 MG TABS tablet Take 1 tablet by mouth daily at 12 noon. Patient not taking: Reported on 06/28/2022    [provider]  promethazine  (PHENERGAN ) 25 MG tablet Take 1 tablet (25 mg total) by mouth every 6 (six) hours as needed for nausea or vomiting. Patient not  taking: Reported on 06/28/2022 06/15/22   Emil Share, DO  rizatriptan  (MAXALT -MLT) 10 MG disintegrating tablet Take 1 tablet (10 mg total) by mouth as needed for migraine. May repeat in 2 hours if needed Patient not taking: Reported on 10/28/2024 06/28/22   Gayland Lauraine PARAS, NP  terconazole  (TERAZOL 7 ) 0.4 % vaginal cream Place 1 applicator vaginally at bedtime. Patient not taking: Reported on 06/28/2022 08/02/21   Rasch, Delon FERNS, NP    Allergies: Patient has no known allergies.    Review of Systems  Cardiovascular:  Positive for chest pain.    Updated Vital Signs BP 130/80 (BP Location: Right Arm)   Pulse 65   Temp 98.4 F (36.9 C) (Oral)   Resp 16   Ht 5' 6 (1.676 m)   Wt 99.8 kg   LMP 10/16/2024 (Exact Date)   SpO2 100%   BMI 35.51 kg/m   Physical Exam Vitals and nursing note reviewed.  Constitutional:      General: She is not in acute distress.    Appearance: She is well-developed. She is not diaphoretic.  HENT:     Head: Normocephalic and atraumatic.  Eyes:     Pupils: Pupils are equal, round, and reactive to light.  Cardiovascular:     Rate and Rhythm: Normal rate and regular rhythm.     Heart sounds: No murmur heard.    No friction rub. No gallop.  Pulmonary:  Effort: Pulmonary effort is normal.     Breath sounds: No wheezing or rales.  Chest:     Chest wall: No tenderness.  Abdominal:     General: There is no distension.     Palpations: Abdomen is soft.     Tenderness: There is no abdominal tenderness.  Musculoskeletal:        General: No tenderness.     Cervical back: Normal range of motion and neck supple.  Skin:    General: Skin is warm and dry.  Neurological:     Mental Status: She is alert and oriented to person, place, and time.  Psychiatric:        Behavior: Behavior normal.     (all labs ordered are listed, but only abnormal results are displayed) Labs Reviewed  CBC WITH DIFFERENTIAL/PLATELET - Abnormal; Notable for the following components:       Result Value   WBC 11.3 (*)    Neutro Abs 9.4 (*)    All other components within normal limits  COMPREHENSIVE METABOLIC PANEL WITH GFR - Abnormal; Notable for the following components:   CO2 21 (*)    Glucose, Bld 116 (*)    Creatinine, Ser 1.02 (*)    All other components within normal limits  LIPASE, BLOOD  HCG, SERUM, QUALITATIVE  TROPONIN T, HIGH SENSITIVITY    EKG: EKG Interpretation Date/Time:  Sunday November 01 2024 20:55:15 EST Ventricular Rate:  60 PR Interval:  133 QRS Duration:  99 QT Interval:  401 QTC Calculation: 401 R Axis:   55  Text Interpretation: Sinus rhythm No old tracing to compare Confirmed by Emil Share [phone] on 11/01/2024 8:56:56 PM  Radiology: [facility] Chest Port 1 View Result Date: 11/01/2024 EXAM: 1 VIEW XRAY OF THE CHEST 11/01/2024 09:20:00 PM COMPARISON: None available. CLINICAL HISTORY: chest pain FINDINGS: LUNGS AND PLEURA: No focal pulmonary opacity. No pulmonary edema. No pleural effusion. No pneumothorax. HEART AND MEDIASTINUM: Mild cardiomegaly. Correlate for pericardial effusion. BONES AND SOFT TISSUES: No acute osseous abnormality. IMPRESSION: 1. Mild cardiomegaly. Consider evaluation for pericardial effusion if clinically indicated. Electronically signed by: Pinkie Pebbles MD 11/01/2024 09:24 PM EST RP Workstation: [id number]     Procedures     EMERGENCY DEPARTMENT US  CARDIAC EXAM Study: Limited Ultrasound of the Heart and Pericardium  INDICATIONS:Chest pain Multiple views of the heart and pericardium were obtained in real-time with a multi-frequency probe.  PERFORMED AB:Fbdzoq IMAGES ARCHIVED?: Yes LIMITATIONS:  None VIEWS USED: Subcostal 4 chamber, Parasternal long axis, Apical 4 chamber , and Inferior Vena Cava INTERPRETATION: Cardiac activity present, Pericardial effusioin absent, Cardiac tamponade absent, Normal contractility, and IVC normal   Medications Ordered in the ED  alum & mag hydroxide-simeth (MAALOX/MYLANTA)  200-200-20 MG/5ML suspension 30 mL (30 mLs Oral Given 11/01/24 2126)                                    Medical Decision Making Amount and/or Complexity of Data Reviewed Labs: ordered. Radiology: ordered.  Risk OTC drugs.   28 yo F with a chief complaint of chest pain.  Has been going on for about a week.  Was recently sick with what sounds like an upper respiratory illness.  She is PERC negative.  Chest x-ray with some concern for cardiac enlargement.  Will obtain a laboratory evaluation including troponin  Troponins negative.  Mild leukocytosis with a neutrophilic predominance.  No anemia, no significant electrolyte  abnormalities FTs and lipase are unremarkable.  Pregnancy test is negative.  Bedside ultrasound performed without obvious pericardial effusion.  No obvious diminished ejection fraction.  No signs of fluid overload.  Will have the patient follow-up with her PCP.  9:59 PM:  I have discussed the diagnosis/risks/treatment options with the patient.  Evaluation and diagnostic testing in the emergency department does not suggest an emergent condition requiring admission or immediate intervention beyond what has been performed at this time.  They will follow up with PCP. We also discussed returning to the ED immediately if new or worsening sx occur. We discussed the sx which are most concerning (e.g., sudden worsening pain, fever, inability to tolerate by mouth, exertional symptoms, hemoptysis, syncope) that necessitate immediate return. Medications administered to the patient during their visit and any new prescriptions provided to the patient are listed below.  Medications given during this visit Medications  alum & mag hydroxide-simeth (MAALOX/MYLANTA) 200-200-20 MG/5ML suspension 30 mL (30 mLs Oral Given 11/01/24 2126)     The patient appears reasonably screen and/or stabilized for discharge and I doubt any other medical condition or other [facility] requiring further screening,  evaluation, or treatment in the ED at this time prior to discharge.       Final diagnoses:  Nonspecific chest pain    ED Discharge Orders     None          Emil Share, DO 11/01/24 2159
